# Patient Record
Sex: Female | Born: 1970 | Race: Black or African American | Hispanic: No | Smoking: Never smoker
Health system: Southern US, Community
[De-identification: ages and names within clinical notes are randomized; demographics above are authoritative.]

## PROBLEM LIST (undated history)

## (undated) DIAGNOSIS — E611 Iron deficiency: Secondary | ICD-10-CM

## (undated) DIAGNOSIS — G40909 Epilepsy, unspecified, not intractable, without status epilepticus: Secondary | ICD-10-CM

## (undated) DIAGNOSIS — C73 Malignant neoplasm of thyroid gland: Secondary | ICD-10-CM

## (undated) DIAGNOSIS — E039 Hypothyroidism, unspecified: Secondary | ICD-10-CM

## (undated) HISTORY — PX: BREAST EXCISIONAL BIOPSY: SUR124

## (undated) HISTORY — PX: THYROIDECTOMY: SHX17

---

## 1998-12-20 ENCOUNTER — Other Ambulatory Visit: Admission: RE | Admit: 1998-12-20 | Discharge: 1998-12-20 | Payer: Self-pay | Admitting: *Deleted

## 1999-03-06 ENCOUNTER — Encounter: Payer: Self-pay | Admitting: Emergency Medicine

## 1999-03-06 ENCOUNTER — Emergency Department (HOSPITAL_COMMUNITY): Admission: EM | Admit: 1999-03-06 | Discharge: 1999-03-06 | Payer: Self-pay | Admitting: Emergency Medicine

## 1999-08-19 ENCOUNTER — Emergency Department (HOSPITAL_COMMUNITY): Admission: EM | Admit: 1999-08-19 | Discharge: 1999-08-19 | Payer: Self-pay | Admitting: *Deleted

## 1999-12-23 ENCOUNTER — Other Ambulatory Visit: Admission: RE | Admit: 1999-12-23 | Discharge: 1999-12-23 | Payer: Self-pay | Admitting: *Deleted

## 2001-01-16 ENCOUNTER — Other Ambulatory Visit: Admission: RE | Admit: 2001-01-16 | Discharge: 2001-01-16 | Payer: Self-pay | Admitting: Gynecology

## 2001-07-12 ENCOUNTER — Emergency Department (HOSPITAL_COMMUNITY): Admission: EM | Admit: 2001-07-12 | Discharge: 2001-07-12 | Payer: Self-pay | Admitting: Emergency Medicine

## 2001-07-12 ENCOUNTER — Encounter: Payer: Self-pay | Admitting: *Deleted

## 2002-01-23 ENCOUNTER — Other Ambulatory Visit: Admission: RE | Admit: 2002-01-23 | Discharge: 2002-01-23 | Payer: Self-pay | Admitting: Gynecology

## 2002-07-13 ENCOUNTER — Emergency Department (HOSPITAL_COMMUNITY): Admission: EM | Admit: 2002-07-13 | Discharge: 2002-07-14 | Payer: Self-pay | Admitting: Emergency Medicine

## 2002-07-14 ENCOUNTER — Encounter: Payer: Self-pay | Admitting: Emergency Medicine

## 2003-01-15 ENCOUNTER — Encounter: Payer: Self-pay | Admitting: Obstetrics & Gynecology

## 2003-01-15 ENCOUNTER — Ambulatory Visit (HOSPITAL_COMMUNITY): Admission: RE | Admit: 2003-01-15 | Discharge: 2003-01-15 | Payer: Self-pay | Admitting: Obstetrics & Gynecology

## 2004-11-23 ENCOUNTER — Encounter: Admission: RE | Admit: 2004-11-23 | Discharge: 2005-02-21 | Payer: Self-pay | Admitting: Obstetrics and Gynecology

## 2008-02-03 ENCOUNTER — Encounter (HOSPITAL_COMMUNITY): Admission: RE | Admit: 2008-02-03 | Discharge: 2008-02-04 | Payer: Self-pay | Admitting: Internal Medicine

## 2008-02-27 ENCOUNTER — Other Ambulatory Visit: Admission: RE | Admit: 2008-02-27 | Discharge: 2008-02-27 | Payer: Self-pay | Admitting: Surgery

## 2008-05-28 ENCOUNTER — Emergency Department (HOSPITAL_COMMUNITY): Admission: EM | Admit: 2008-05-28 | Discharge: 2008-05-28 | Payer: Self-pay | Admitting: Emergency Medicine

## 2008-12-22 ENCOUNTER — Encounter: Admission: RE | Admit: 2008-12-22 | Discharge: 2008-12-22 | Payer: Self-pay | Admitting: Internal Medicine

## 2009-12-02 ENCOUNTER — Emergency Department (HOSPITAL_COMMUNITY): Admission: EM | Admit: 2009-12-02 | Discharge: 2009-12-02 | Payer: Self-pay | Admitting: Family Medicine

## 2010-01-24 ENCOUNTER — Ambulatory Visit (HOSPITAL_COMMUNITY): Admission: RE | Admit: 2010-01-24 | Discharge: 2010-01-25 | Payer: Self-pay | Admitting: Surgery

## 2010-01-24 ENCOUNTER — Encounter (INDEPENDENT_AMBULATORY_CARE_PROVIDER_SITE_OTHER): Payer: Self-pay | Admitting: Surgery

## 2010-03-16 ENCOUNTER — Encounter (HOSPITAL_COMMUNITY): Admission: RE | Admit: 2010-03-16 | Discharge: 2010-05-03 | Payer: Self-pay | Admitting: Internal Medicine

## 2010-09-02 ENCOUNTER — Emergency Department (HOSPITAL_COMMUNITY)
Admission: EM | Admit: 2010-09-02 | Discharge: 2010-09-02 | Payer: Self-pay | Source: Home / Self Care | Admitting: Emergency Medicine

## 2010-10-14 ENCOUNTER — Encounter
Admission: RE | Admit: 2010-10-14 | Discharge: 2010-10-14 | Payer: Self-pay | Source: Home / Self Care | Attending: Internal Medicine | Admitting: Internal Medicine

## 2010-11-12 ENCOUNTER — Emergency Department (HOSPITAL_COMMUNITY)
Admission: EM | Admit: 2010-11-12 | Discharge: 2010-11-12 | Payer: Self-pay | Source: Home / Self Care | Admitting: Emergency Medicine

## 2010-11-21 LAB — POCT I-STAT, CHEM 8
BUN: 10 mg/dL (ref 6–23)
Calcium, Ion: 1.13 mmol/L (ref 1.12–1.32)
Chloride: 110 mEq/L (ref 96–112)
Creatinine, Ser: 0.5 mg/dL (ref 0.4–1.2)
Glucose, Bld: 95 mg/dL (ref 70–99)
HCT: 34 % — ABNORMAL LOW (ref 36.0–46.0)
Hemoglobin: 11.6 g/dL — ABNORMAL LOW (ref 12.0–15.0)
Potassium: 4.3 mEq/L (ref 3.5–5.1)
Sodium: 140 mEq/L (ref 135–145)
TCO2: 19 mmol/L (ref 0–100)

## 2010-11-21 LAB — POCT PREGNANCY, URINE: Preg Test, Ur: NEGATIVE

## 2010-12-01 ENCOUNTER — Encounter
Admission: RE | Admit: 2010-12-01 | Discharge: 2010-12-01 | Payer: Self-pay | Source: Home / Self Care | Attending: Internal Medicine | Admitting: Internal Medicine

## 2010-12-07 ENCOUNTER — Encounter: Payer: Self-pay | Admitting: Internal Medicine

## 2011-01-06 ENCOUNTER — Emergency Department (HOSPITAL_COMMUNITY)
Admission: EM | Admit: 2011-01-06 | Discharge: 2011-01-06 | Disposition: A | Discharge status: Home / Self Care | Payer: BC Managed Care – PPO | Attending: Emergency Medicine | Admitting: Emergency Medicine

## 2011-01-06 DIAGNOSIS — G40909 Epilepsy, unspecified, not intractable, without status epilepticus: Secondary | ICD-10-CM | POA: Insufficient documentation

## 2011-01-06 DIAGNOSIS — G4700 Insomnia, unspecified: Secondary | ICD-10-CM | POA: Insufficient documentation

## 2011-01-24 LAB — HCG, SERUM, QUALITATIVE: Preg, Serum: NEGATIVE

## 2011-01-30 LAB — COMPREHENSIVE METABOLIC PANEL
ALT: 8 U/L (ref 0–35)
AST: 14 U/L (ref 0–37)
Albumin: 4.1 g/dL (ref 3.5–5.2)
Alkaline Phosphatase: 58 U/L (ref 39–117)
BUN: 10 mg/dL (ref 6–23)
CO2: 25 mEq/L (ref 19–32)
Calcium: 9 mg/dL (ref 8.4–10.5)
Chloride: 104 mEq/L (ref 96–112)
Creatinine, Ser: 0.63 mg/dL (ref 0.4–1.2)
GFR calc Af Amer: >60 mL/min (ref >60)
GFR calc non Af Amer: >60 mL/min (ref >60)
Glucose, Bld: 76 mg/dL (ref 70–99)
Potassium: 3.9 mEq/L (ref 3.5–5.1)
Sodium: 134 mEq/L — ABNORMAL LOW (ref 135–145)
Total Bilirubin: 0.5 mg/dL (ref 0.3–1.2)
Total Protein: 7.3 g/dL (ref 6.0–8.3)

## 2011-01-30 LAB — CBC
HCT: 33.9 % — ABNORMAL LOW (ref 36.0–46.0)
Hemoglobin: 11.5 g/dL — ABNORMAL LOW (ref 12.0–15.0)
MCHC: 33.8 g/dL (ref 30.0–36.0)
MCV: 82.2 fL (ref 78.0–100.0)
Platelets: 231 10*3/uL (ref 150–400)
RBC: 4.13 MIL/uL (ref 3.87–5.11)
RDW: 16.7 % — ABNORMAL HIGH (ref 11.5–15.5)
WBC: 6.9 10*3/uL (ref 4.0–10.5)

## 2011-01-30 LAB — DIFFERENTIAL
Basophils Absolute: 0 10*3/uL (ref 0.0–0.1)
Basophils Relative: 0 % (ref 0–1)
Eosinophils Absolute: 0.1 10*3/uL (ref 0.0–0.7)
Eosinophils Relative: 2 % (ref 0–5)
Lymphocytes Relative: 25 % (ref 12–46)
Lymphs Abs: 1.7 10*3/uL (ref 0.7–4.0)
Monocytes Absolute: 0.6 10*3/uL (ref 0.1–1.0)
Monocytes Relative: 8 % (ref 3–12)
Neutro Abs: 4.5 10*3/uL (ref 1.7–7.7)
Neutrophils Relative %: 65 % (ref 43–77)

## 2011-01-30 LAB — CALCIUM
Calcium: 8 mg/dL — ABNORMAL LOW (ref 8.4–10.5)
Calcium: 8.3 mg/dL — ABNORMAL LOW (ref 8.4–10.5)

## 2011-01-30 LAB — HCG, SERUM, QUALITATIVE: Preg, Serum: NEGATIVE

## 2011-03-27 ENCOUNTER — Emergency Department (HOSPITAL_COMMUNITY)
Admission: EM | Admit: 2011-03-27 | Discharge: 2011-03-27 | Disposition: A | Discharge status: Home / Self Care | Payer: BC Managed Care – PPO | Attending: Emergency Medicine | Admitting: Emergency Medicine

## 2011-03-27 DIAGNOSIS — Z85850 Personal history of malignant neoplasm of thyroid: Secondary | ICD-10-CM | POA: Insufficient documentation

## 2011-03-27 DIAGNOSIS — Z79899 Other long term (current) drug therapy: Secondary | ICD-10-CM | POA: Insufficient documentation

## 2011-03-27 DIAGNOSIS — G40909 Epilepsy, unspecified, not intractable, without status epilepticus: Secondary | ICD-10-CM | POA: Insufficient documentation

## 2011-04-19 ENCOUNTER — Other Ambulatory Visit (HOSPITAL_COMMUNITY): Payer: Self-pay | Admitting: Internal Medicine

## 2011-04-19 DIAGNOSIS — C73 Malignant neoplasm of thyroid gland: Secondary | ICD-10-CM

## 2011-05-22 ENCOUNTER — Encounter (HOSPITAL_COMMUNITY)
Admission: RE | Admit: 2011-05-22 | Discharge: 2011-05-22 | Disposition: A | Discharge status: Home / Self Care | Payer: BC Managed Care – PPO | Source: Ambulatory Visit | Attending: Internal Medicine | Admitting: Internal Medicine

## 2011-05-22 DIAGNOSIS — C73 Malignant neoplasm of thyroid gland: Secondary | ICD-10-CM | POA: Insufficient documentation

## 2011-05-23 ENCOUNTER — Encounter (HOSPITAL_COMMUNITY)
Admission: RE | Admit: 2011-05-23 | Discharge: 2011-05-23 | Disposition: A | Discharge status: Home / Self Care | Payer: BC Managed Care – PPO | Source: Ambulatory Visit | Attending: Internal Medicine | Admitting: Internal Medicine

## 2011-05-23 DIAGNOSIS — C73 Malignant neoplasm of thyroid gland: Secondary | ICD-10-CM | POA: Insufficient documentation

## 2011-05-23 DIAGNOSIS — Z09 Encounter for follow-up examination after completed treatment for conditions other than malignant neoplasm: Secondary | ICD-10-CM | POA: Insufficient documentation

## 2011-05-24 ENCOUNTER — Encounter (HOSPITAL_COMMUNITY)
Admission: RE | Admit: 2011-05-24 | Discharge: 2011-05-24 | Disposition: A | Discharge status: Home / Self Care | Payer: BC Managed Care – PPO | Source: Ambulatory Visit | Attending: Internal Medicine | Admitting: Internal Medicine

## 2011-05-24 LAB — HCG, SERUM, QUALITATIVE: Preg, Serum: NEGATIVE

## 2011-05-26 ENCOUNTER — Encounter (HOSPITAL_COMMUNITY)
Admission: RE | Admit: 2011-05-26 | Discharge: 2011-05-26 | Disposition: A | Discharge status: Home / Self Care | Payer: BC Managed Care – PPO | Source: Ambulatory Visit | Attending: Internal Medicine | Admitting: Internal Medicine

## 2011-05-26 MED ORDER — SODIUM IODIDE I 131 CAPSULE
4.0000 | Freq: Once | INTRAVENOUS | Status: AC | PRN
  Administered 2011-05-26: 4 via ORAL

## 2011-08-04 LAB — URINALYSIS, ROUTINE W REFLEX MICROSCOPIC
Glucose, UA: NEGATIVE
Hgb urine dipstick: NEGATIVE
Protein, ur: NEGATIVE
Specific Gravity, Urine: 1.018
pH: 6

## 2011-08-04 LAB — POCT PREGNANCY, URINE
Operator id: 231701
Preg Test, Ur: NEGATIVE

## 2011-08-04 LAB — DIFFERENTIAL
Basophils Absolute: 0
Lymphocytes Relative: 10 — ABNORMAL LOW
Lymphs Abs: 0.9
Neutro Abs: 7.4

## 2011-08-04 LAB — CBC
HCT: 32.9 — ABNORMAL LOW
Hemoglobin: 11 — ABNORMAL LOW
Platelets: 239
RDW: 14.1
WBC: 9

## 2011-08-04 LAB — POCT I-STAT, CHEM 8
BUN: 12
Chloride: 107
Creatinine, Ser: 0.7
Potassium: 3.9
Sodium: 139

## 2011-08-04 LAB — URINE MICROSCOPIC-ADD ON

## 2012-05-02 ENCOUNTER — Other Ambulatory Visit: Payer: Self-pay | Admitting: Internal Medicine

## 2012-05-02 DIAGNOSIS — C73 Malignant neoplasm of thyroid gland: Secondary | ICD-10-CM

## 2012-05-10 ENCOUNTER — Other Ambulatory Visit: Payer: Self-pay | Admitting: Internal Medicine

## 2012-05-10 ENCOUNTER — Ambulatory Visit
Admission: RE | Admit: 2012-05-10 | Discharge: 2012-05-10 | Disposition: A | Discharge status: Home / Self Care | Payer: BC Managed Care – PPO | Source: Ambulatory Visit | Attending: Internal Medicine | Admitting: Internal Medicine

## 2012-05-10 DIAGNOSIS — C73 Malignant neoplasm of thyroid gland: Secondary | ICD-10-CM

## 2012-05-10 DIAGNOSIS — Z1231 Encounter for screening mammogram for malignant neoplasm of breast: Secondary | ICD-10-CM

## 2012-05-13 ENCOUNTER — Other Ambulatory Visit: Payer: BC Managed Care – PPO

## 2012-05-15 ENCOUNTER — Ambulatory Visit
Admission: RE | Admit: 2012-05-15 | Discharge: 2012-05-15 | Disposition: A | Discharge status: Home / Self Care | Payer: BC Managed Care – PPO | Source: Ambulatory Visit | Attending: Internal Medicine | Admitting: Internal Medicine

## 2012-05-15 DIAGNOSIS — Z1231 Encounter for screening mammogram for malignant neoplasm of breast: Secondary | ICD-10-CM

## 2013-06-17 ENCOUNTER — Other Ambulatory Visit: Payer: Self-pay

## 2013-06-17 DIAGNOSIS — Z1231 Encounter for screening mammogram for malignant neoplasm of breast: Secondary | ICD-10-CM

## 2013-06-27 ENCOUNTER — Ambulatory Visit
Admission: RE | Admit: 2013-06-27 | Discharge: 2013-06-27 | Disposition: A | Discharge status: Home / Self Care | Payer: BC Managed Care – PPO | Source: Ambulatory Visit

## 2013-06-27 DIAGNOSIS — Z1231 Encounter for screening mammogram for malignant neoplasm of breast: Secondary | ICD-10-CM

## 2013-12-18 ENCOUNTER — Emergency Department (INDEPENDENT_AMBULATORY_CARE_PROVIDER_SITE_OTHER): Payer: BC Managed Care – PPO

## 2013-12-18 ENCOUNTER — Emergency Department (HOSPITAL_COMMUNITY)
Admission: EM | Admit: 2013-12-18 | Discharge: 2013-12-18 | Disposition: A | Discharge status: Home / Self Care | Payer: BC Managed Care – PPO | Source: Home / Self Care | Attending: Family Medicine | Admitting: Family Medicine

## 2013-12-18 ENCOUNTER — Encounter (HOSPITAL_COMMUNITY): Payer: Self-pay | Admitting: Family Medicine

## 2013-12-18 DIAGNOSIS — S9030XA Contusion of unspecified foot, initial encounter: Secondary | ICD-10-CM

## 2013-12-18 HISTORY — DX: Iron deficiency: E61.1

## 2013-12-18 HISTORY — DX: Hypothyroidism, unspecified: E03.9

## 2013-12-18 HISTORY — DX: Epilepsy, unspecified, not intractable, without status epilepticus: G40.909

## 2013-12-18 HISTORY — DX: Malignant neoplasm of thyroid gland: C73

## 2013-12-18 NOTE — Discharge Instructions (Signed)
Foot Contusion °A foot contusion is a deep bruise to the foot. Contusions are the result of an injury that caused bleeding under the skin. The contusion may turn blue, purple, or yellow. Minor injuries will give you a painless contusion, but more severe contusions may stay painful and swollen for a few weeks. °CAUSES  °A foot contusion comes from a direct blow to that area, such as a heavy object falling on the foot. °SYMPTOMS  °· Swelling of the foot. °· Discoloration of the foot. °· Tenderness or soreness of the foot. °DIAGNOSIS  °You will have a physical exam and will be asked about your history. You may need an X-ray of your foot to look for a broken bone (fracture).  °TREATMENT  °An elastic wrap may be recommended to support your foot. Resting, elevating, and applying cold compresses to your foot are often the best treatments for a foot contusion. Over-the-counter medicines may also be recommended for pain control. °HOME CARE INSTRUCTIONS  °· Put ice on the injured area. °· Put ice in a plastic bag. °· Place a towel between your skin and the bag. °· Leave the ice on for 15-20 minutes, 03-04 times a day. °· Only take over-the-counter or prescription medicines for pain, discomfort, or fever as directed by your caregiver. °· If told, use an elastic wrap as directed. This can help reduce swelling. You may remove the wrap for sleeping, showering, and bathing. If your toes become numb, cold, or blue, take the wrap off and reapply it more loosely. °· Elevate your foot with pillows to reduce swelling. °· Try to avoid standing or walking while the foot is painful. Do not resume use until instructed by your caregiver. Then, begin use gradually. If pain develops, decrease use. Gradually increase activities that do not cause discomfort until you have normal use of your foot. °· See your caregiver as directed. It is very important to keep all follow-up appointments in order to avoid any lasting problems with your foot,  including long-term (chronic) pain. °SEEK IMMEDIATE MEDICAL CARE IF:  °· You have increased redness, swelling, or pain in your foot. °· Your swelling or pain is not relieved with medicines. °· You have loss of feeling in your foot or are unable to move your toes. °· Your foot turns cold or blue. °· You have pain when you move your toes. °· Your foot becomes warm to the touch. °· Your contusion does not improve in 2 days. °MAKE SURE YOU:  °· Understand these instructions. °· Will watch your condition. °· Will get help right away if you are not doing well or get worse. °Document Released: 08/14/2006 Document Revised: 04/23/2012 Document Reviewed: 09/26/2011 °[employer]® Patient Information ©2014 [employer]. ° °

## 2013-12-18 NOTE — ED Provider Notes (Signed)
CSN: [id number]     Arrival date & time 12/18/13  1618 History   First MD Initiated Contact with Patient 12/18/13 1745     Chief Complaint  Patient presents with  . Toe Injury     (Consider location/radiation/quality/duration/timing/severity/associated sxs/prior Treatment) HPI  Right foot injury. Occurred two weeks ago when she dropped a case of paper on it. Initially with swelling and pain that subsided after about 7 days. However, she went out in high heels 5 days ago and the pain was exacerbated and worsening since that point. Pain is exacerbated with pressure. It is alleviated by rest. Alleve ineffective.   Past Medical History  Diagnosis Date  . Seizure disorder   . Iron deficiency   . Thyroid cancer   . Acquired hypothyroidism    Past Surgical History  Procedure Laterality Date  . Thyroidectomy     Family History  Problem Relation Age of Onset  . Hypertension Mother   . Hypertension Father    History  Substance Use Topics  . Smoking status: Never Smoker   . Smokeless tobacco: Not on file  . Alcohol Use: No   OB History   Grav Para Term Preterm Abortions TAB SAB Ect Mult Living                 Review of Systems  See HPI  Allergies  Review of patient's allergies indicates no known allergies.  Home Medications   Current Outpatient Rx  Name  Route  Sig  Dispense  Refill  . cholecalciferol (VITAMIN D) 1000 UNITS tablet   Oral   Take 1,000 Units by mouth daily.         . Fe Fum-Fe Poly-Vit C-Lactobac (FUSION PO)   Oral   Take by mouth.         . lamoTRIgine (LAMICTAL) 100 MG tablet   Oral   Take 100 mg by mouth daily.         Marland Kitchen levothyroxine (SYNTHROID, LEVOTHROID) 150 MCG tablet   Oral   Take 150 mcg by mouth daily before breakfast.          BP 123/75  Pulse 73  Temp(Src) 98.8 F (37.1 C) (Oral)  Resp 16  SpO2 100%  LMP 12/18/2013 Physical Exam  Constitutional: She appears well-developed and well-nourished. No distress.    Musculoskeletal:       Right foot: She exhibits tenderness, bony tenderness and swelling. She exhibits no deformity.       Feet:    ED Course  Procedures (including critical care time) Labs Review Labs Reviewed - No data to display Imaging Review Dg Foot 2 Views Right  12/18/2013   CLINICAL DATA:  Trauma  EXAM: RIGHT FOOT - 2 VIEW  COMPARISON:  None.  FINDINGS: There is no evidence of fracture or dislocation. There is no evidence of arthropathy or other focal bone abnormality. Soft tissues are unremarkable.  IMPRESSION: There is no acute fracture or dislocation.   Electronically Signed   By: Abelardo Diesel M.D.   On: 12/18/2013 18:15      MDM   Final diagnoses:  Contusion, foot    No fracture. Given pain will give post-op shoe to use for next 7 days as needed.     Angelica Ran, MD 12/18/13 2340

## 2013-12-18 NOTE — ED Notes (Signed)
Dropped a case of paper on her R 2nd toe 2 weeks ago. Pain radiates to dorsum of her foot.  Swelling went down and pain went away.  She wore a shoe with a heel on Sat. and it swelled back up and started hurting again.

## 2013-12-19 NOTE — ED Provider Notes (Signed)
Medical screening examination/treatment/procedure(s) were performed by a resident physician or non-physician practitioner and as the supervising physician I was immediately available for consultation/collaboration.  Lynne Leader, MD    Gregor Hams, MD 12/19/13 [phone]

## 2014-02-04 ENCOUNTER — Encounter: Payer: Self-pay | Admitting: Obstetrics & Gynecology

## 2014-02-18 ENCOUNTER — Ambulatory Visit: Payer: BC Managed Care – PPO | Admitting: Obstetrics & Gynecology

## 2014-06-17 ENCOUNTER — Other Ambulatory Visit: Payer: Self-pay | Admitting: Obstetrics and Gynecology

## 2014-11-02 ENCOUNTER — Encounter: Payer: Self-pay | Admitting: *Deleted

## 2014-11-03 ENCOUNTER — Encounter: Payer: Self-pay | Admitting: Obstetrics & Gynecology

## 2015-06-30 ENCOUNTER — Other Ambulatory Visit: Payer: Self-pay | Admitting: Internal Medicine

## 2015-06-30 DIAGNOSIS — Z85850 Personal history of malignant neoplasm of thyroid: Secondary | ICD-10-CM

## 2015-07-02 ENCOUNTER — Ambulatory Visit
Admission: RE | Admit: 2015-07-02 | Discharge: 2015-07-02 | Disposition: A | Discharge status: Home / Self Care | Payer: BC Managed Care – PPO | Source: Ambulatory Visit | Attending: Internal Medicine | Admitting: Internal Medicine

## 2015-07-02 DIAGNOSIS — Z85850 Personal history of malignant neoplasm of thyroid: Secondary | ICD-10-CM

## 2015-07-23 ENCOUNTER — Other Ambulatory Visit: Payer: Self-pay | Admitting: Internal Medicine

## 2015-07-23 DIAGNOSIS — E041 Nontoxic single thyroid nodule: Secondary | ICD-10-CM

## 2015-07-26 ENCOUNTER — Other Ambulatory Visit (HOSPITAL_COMMUNITY): Payer: Self-pay | Admitting: Internal Medicine

## 2015-07-26 DIAGNOSIS — E041 Nontoxic single thyroid nodule: Secondary | ICD-10-CM

## 2015-08-02 ENCOUNTER — Ambulatory Visit (HOSPITAL_COMMUNITY)
Admission: RE | Admit: 2015-08-02 | Discharge: 2015-08-02 | Disposition: A | Discharge status: Home / Self Care | Payer: BC Managed Care – PPO | Source: Ambulatory Visit | Attending: Internal Medicine | Admitting: Internal Medicine

## 2015-08-02 DIAGNOSIS — E890 Postprocedural hypothyroidism: Secondary | ICD-10-CM | POA: Diagnosis not present

## 2015-08-02 DIAGNOSIS — M799 Soft tissue disorder, unspecified: Secondary | ICD-10-CM | POA: Insufficient documentation

## 2015-08-02 DIAGNOSIS — Z85850 Personal history of malignant neoplasm of thyroid: Secondary | ICD-10-CM | POA: Diagnosis not present

## 2015-08-02 DIAGNOSIS — E041 Nontoxic single thyroid nodule: Secondary | ICD-10-CM

## 2015-08-02 MED ORDER — LIDOCAINE HCL (PF) 1 % IJ SOLN
INTRAMUSCULAR | Status: AC
  Filled 2015-08-02: qty 10

## 2015-08-02 NOTE — Procedures (Signed)
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  Complications: None Recommendations:  - Ok to shower tomorrow - Do not submerge for 7 days - Routine line care  - follow up result  Signed,  Dulcy Fanny. Earleen Newport, DO

## 2015-08-19 ENCOUNTER — Other Ambulatory Visit: Payer: BC Managed Care – PPO

## 2015-08-30 ENCOUNTER — Other Ambulatory Visit: Payer: Self-pay | Admitting: Internal Medicine

## 2015-08-30 DIAGNOSIS — C73 Malignant neoplasm of thyroid gland: Secondary | ICD-10-CM

## 2015-12-10 ENCOUNTER — Other Ambulatory Visit: Payer: Self-pay | Admitting: Internal Medicine

## 2015-12-10 DIAGNOSIS — E2839 Other primary ovarian failure: Secondary | ICD-10-CM

## 2015-12-14 ENCOUNTER — Ambulatory Visit
Admission: RE | Admit: 2015-12-14 | Discharge: 2015-12-14 | Disposition: A | Discharge status: Home / Self Care | Payer: BC Managed Care – PPO | Source: Ambulatory Visit | Attending: Internal Medicine | Admitting: Internal Medicine

## 2015-12-14 ENCOUNTER — Other Ambulatory Visit: Payer: Self-pay | Admitting: Internal Medicine

## 2015-12-14 DIAGNOSIS — Z1231 Encounter for screening mammogram for malignant neoplasm of breast: Secondary | ICD-10-CM

## 2015-12-14 DIAGNOSIS — E2839 Other primary ovarian failure: Secondary | ICD-10-CM

## 2015-12-15 ENCOUNTER — Ambulatory Visit
Admission: RE | Admit: 2015-12-15 | Discharge: 2015-12-15 | Disposition: A | Discharge status: Home / Self Care | Payer: BC Managed Care – PPO | Source: Ambulatory Visit | Attending: Internal Medicine | Admitting: Internal Medicine

## 2015-12-15 DIAGNOSIS — Z1231 Encounter for screening mammogram for malignant neoplasm of breast: Secondary | ICD-10-CM

## 2016-02-29 ENCOUNTER — Inpatient Hospital Stay: Admission: RE | Admit: 2016-02-29 | Payer: BC Managed Care – PPO | Source: Ambulatory Visit

## 2016-04-05 ENCOUNTER — Ambulatory Visit
Admission: RE | Admit: 2016-04-05 | Discharge: 2016-04-05 | Disposition: A | Discharge status: Home / Self Care | Payer: BC Managed Care – PPO | Source: Ambulatory Visit | Attending: Internal Medicine | Admitting: Internal Medicine

## 2016-04-05 DIAGNOSIS — C73 Malignant neoplasm of thyroid gland: Secondary | ICD-10-CM

## 2016-09-20 ENCOUNTER — Emergency Department (HOSPITAL_COMMUNITY)
Admission: EM | Admit: 2016-09-20 | Discharge: 2016-09-20 | Disposition: A | Discharge status: Home / Self Care | Payer: BC Managed Care – PPO | Attending: Emergency Medicine | Admitting: Emergency Medicine

## 2016-09-20 ENCOUNTER — Encounter (HOSPITAL_COMMUNITY): Payer: Self-pay | Admitting: *Deleted

## 2016-09-20 DIAGNOSIS — T781XXA Other adverse food reactions, not elsewhere classified, initial encounter: Secondary | ICD-10-CM | POA: Diagnosis present

## 2016-09-20 DIAGNOSIS — T782XXA Anaphylactic shock, unspecified, initial encounter: Secondary | ICD-10-CM

## 2016-09-20 DIAGNOSIS — Z85850 Personal history of malignant neoplasm of thyroid: Secondary | ICD-10-CM | POA: Insufficient documentation

## 2016-09-20 DIAGNOSIS — T7805XA Anaphylactic reaction due to tree nuts and seeds, initial encounter: Secondary | ICD-10-CM | POA: Insufficient documentation

## 2016-09-20 MED ORDER — EPINEPHRINE 0.3 MG/0.3ML IJ SOAJ: 0.3000 mg | Freq: Once | INTRAMUSCULAR | 1 refills | Status: AC

## 2016-09-20 MED ORDER — ONDANSETRON HCL 4 MG/2ML IJ SOLN
4.0000 mg | Freq: Once | INTRAMUSCULAR | Status: AC
  Administered 2016-09-20: 4 mg via INTRAVENOUS
  Filled 2016-09-20: qty 2

## 2016-09-20 MED ORDER — PREDNISONE 20 MG PO TABS: ORAL_TABLET | ORAL | 0 refills | Status: DC

## 2016-09-20 NOTE — ED Notes (Signed)
Bed: [id number] Expected date:  Expected time:  Means of arrival:  Comments: EMS allergic reaction

## 2016-09-20 NOTE — ED Provider Notes (Signed)
[facility] DEPT Provider Note   CSN: [id number] Arrival date & time: 09/20/16  1306     History   Chief Complaint Chief Complaint  Patient presents with  . Allergic Reaction    HPI Tammy Nichols is a 45 y.o. female.  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 She was given a bagel by a school teacher and did not rouse and had walnuts. Immediately after eating she started feeling lip burning, roof of the mouth swelling, throat burning and itching and having a hoarse voice. Denies any diffuse rash. The school nurse gave her an EpiPen and she started to feel somewhat better. She also started having nausea and dry heaving after the EpiPen. She feels like all the swelling and itching and burning is gone. She was given solumedrol and Benadryl by EMS. Also given albuterol. Her EpiPen is currently expired. She has never had anaphylaxis before.  Past Medical History:  Diagnosis Date  . Acquired hypothyroidism   . Iron deficiency   . Seizure disorder ([place])   . Thyroid cancer ([place])     There are no active problems to display for this patient.   Past Surgical History:  Procedure Laterality Date  . THYROIDECTOMY      OB History    No data available       Home Medications    Prior to Admission medications   Medication Sig Start Date End Date Taking? Authorizing Provider  lamoTRIgine (LAMICTAL) 100 MG tablet Take 100 mg by mouth daily.   Yes Historical Provider, MD  levothyroxine (SYNTHROID, LEVOTHROID) 112 MCG tablet Take 224 mcg by mouth daily. 07/04/16  Yes Historical Provider, MD  EPINEPHrine 0.3 mg/0.3 mL IJ SOAJ injection Inject 0.3 mLs (0.3 mg total) into the muscle once. 09/20/16 09/20/16  Sherwood Gambler, MD  predniSONE (DELTASONE) 20 MG tablet 2 tabs po daily x 4 days 09/21/16   Sherwood Gambler, MD    Family History Family History  Problem Relation Age of Onset  . Hypertension Mother   . Hypertension  Father     Social History Social History  Substance Use Topics  . Smoking status: Never Smoker  . Smokeless tobacco: Never Used  . Alcohol use No     Allergies   Other   Review of Systems Review of Systems  HENT: Positive for trouble swallowing and voice change.   Respiratory: Positive for shortness of breath.   Gastrointestinal: Positive for nausea. Negative for vomiting.  Skin: Negative for rash.  All other systems reviewed and are negative.    Physical Exam Updated Vital Signs BP 128/85   Pulse 92   Temp 98.3 F (36.8 C) (Oral)   Resp 20   LMP 06/06/2016 (Approximate)   SpO2 96%   Physical Exam  Constitutional: She is oriented to person, place, and time. She appears well-developed and well-nourished.  HENT:  Head: Normocephalic and atraumatic.  Right Ear: External ear normal.  Left Ear: External ear normal.  Nose: Nose normal.  Mouth/Throat: Oropharynx is clear and moist.  No oropharyngeal swelling, no lip swelling. No stridor  Eyes: Right eye exhibits no discharge. Left eye exhibits no discharge.  Cardiovascular: Normal rate, regular rhythm and normal heart sounds.   Pulmonary/Chest: Effort normal and breath sounds normal. No stridor. She has no wheezes.  Abdominal: Soft. She exhibits no distension.  Neurological: She is alert and oriented to person, place, and time.  Skin: Skin is warm and dry.  No rash noted.  Nursing note and vitals reviewed.    ED Treatments / Results  Labs (all labs ordered are listed, but only abnormal results are displayed) Labs Reviewed - No data to display  EKG  EKG Interpretation None       Radiology No results found.  Procedures Procedures (including critical care time)  Medications Ordered in ED Medications  ondansetron (ZOFRAN) injection 4 mg (4 mg Intravenous Given 09/20/16 1538)     Initial Impression / Assessment and Plan / ED Course  I have reviewed the triage vital signs and the nursing  notes.  Pertinent labs & imaging results that were available during my care of the patient were reviewed by me and considered in my medical decision making (see chart for details).  Clinical Course as of Sep 20 1710  Wed Sep 20, 2016  1401 Overall appears well. Zofran, monitor. Will need epipen, prednisone on discharge.   [SG]    Clinical Course User Index [SG] Sherwood Gambler, MD    Patient continues to feel well, no signs of recurrent allergic reaction or anaphylaxis. Will refill her epinephrine and give a burst of prednisone. It has now been over 4 hours and she was given an EpiPen. Discussed strict return precautions.  Final Clinical Impressions(s) / ED Diagnoses   Final diagnoses:  Anaphylaxis, initial encounter    New Prescriptions New Prescriptions   EPINEPHRINE 0.3 MG/0.3 ML IJ SOAJ INJECTION    Inject 0.3 mLs (0.3 mg total) into the muscle once.   PREDNISONE (DELTASONE) 20 MG TABLET    2 tabs po daily x 4 days     Sherwood Gambler, MD 09/20/16 1711

## 2016-09-20 NOTE — ED Triage Notes (Signed)
Per EMS pt had an allergic reaction to treenuts, epipen administered prior to their arrival, upon their arrival pt was still in significant distress. 5 mg albuterol, 125 mg IV solumedrol and 50 mg benadryl IM administered en route. Per EMS pt reports relief.

## 2016-09-20 NOTE — ED Notes (Signed)
Patient given ginger ale. 

## 2017-06-14 IMAGING — US US BIOPSY
1 series · 7 of 7 positions shown · non-contrast
Comparison: none

CLINICAL DATA: 44-year-old female with a history of papillary
thyroid carcinoma. Total thyroidectomy 5 years prior to today's
presentation.

[Series 1: us biopsy · 0.05mm/px · 7 acquisitions, 7 frames shown]
[im 1/7]
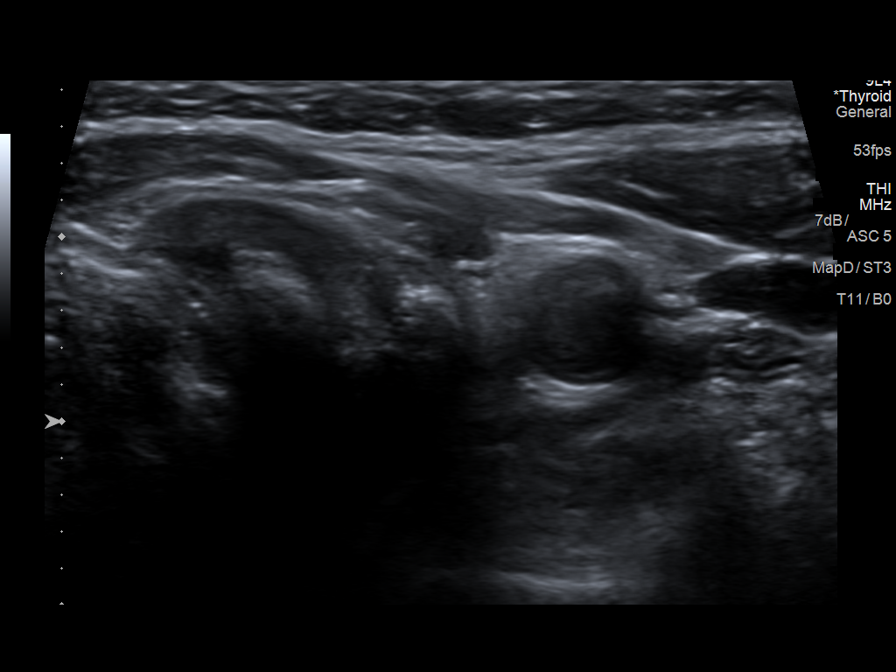
[im 2/7]
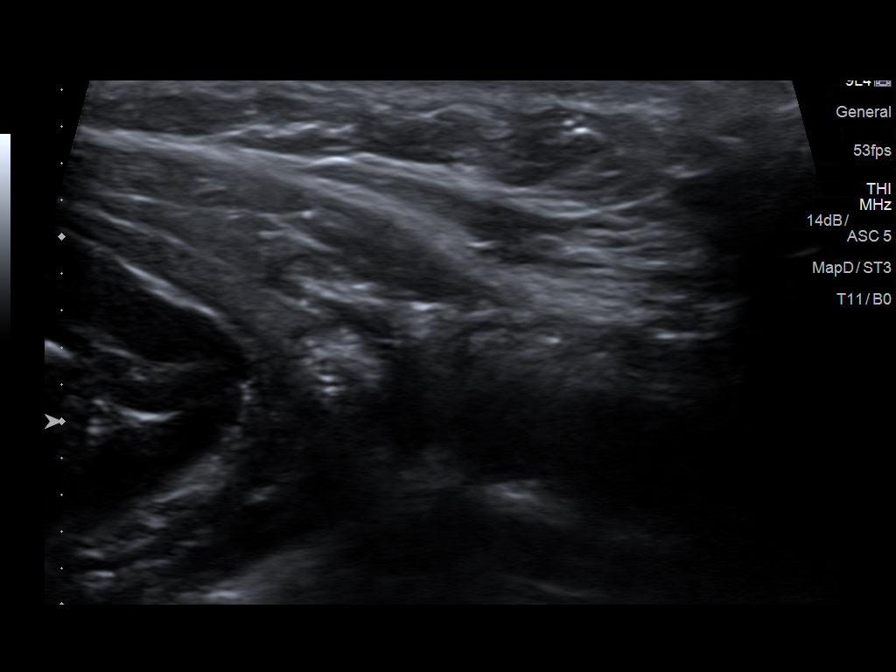
[im 3/7]
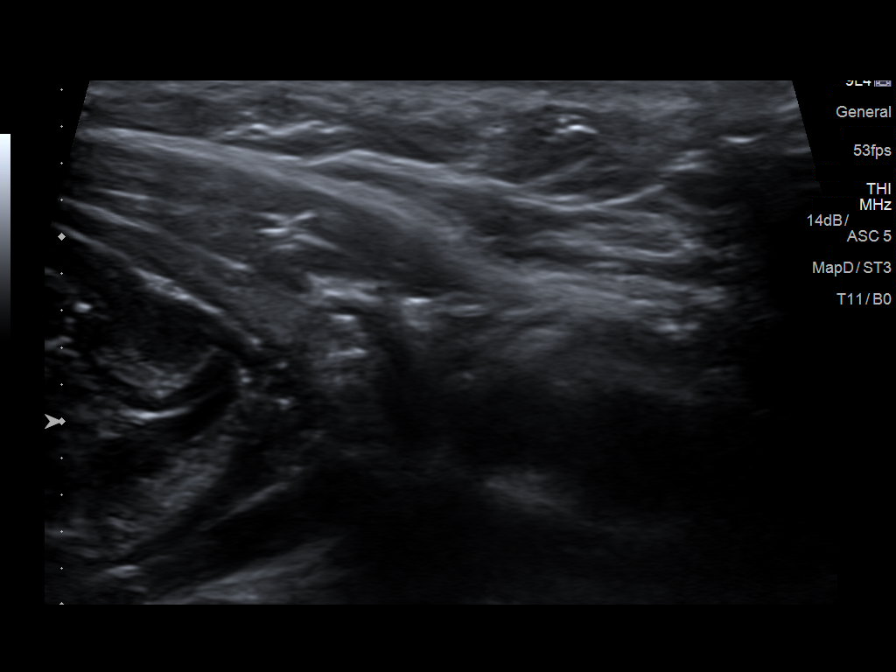
[im 4/7]
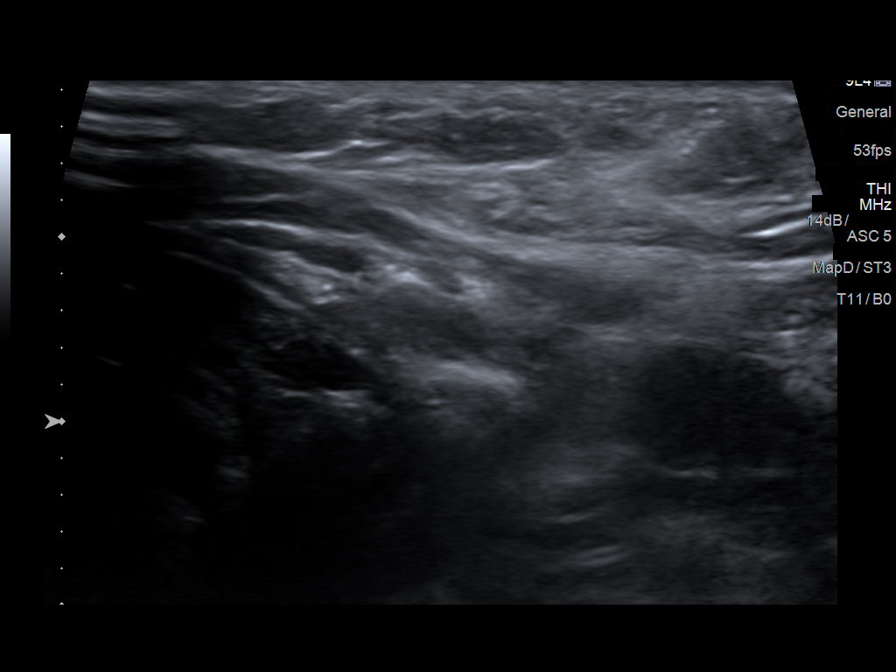
[im 5/7]
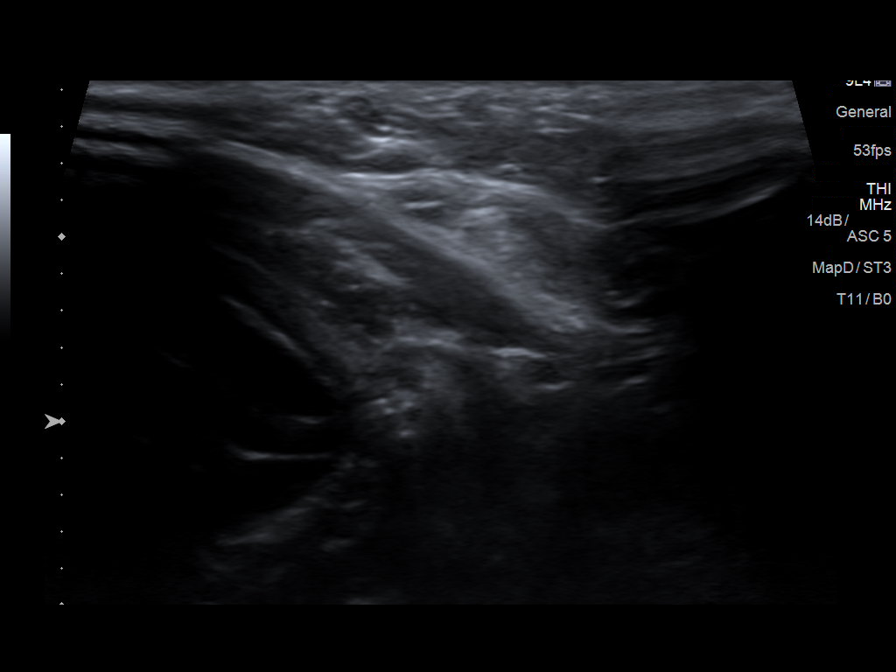
[im 6/7]
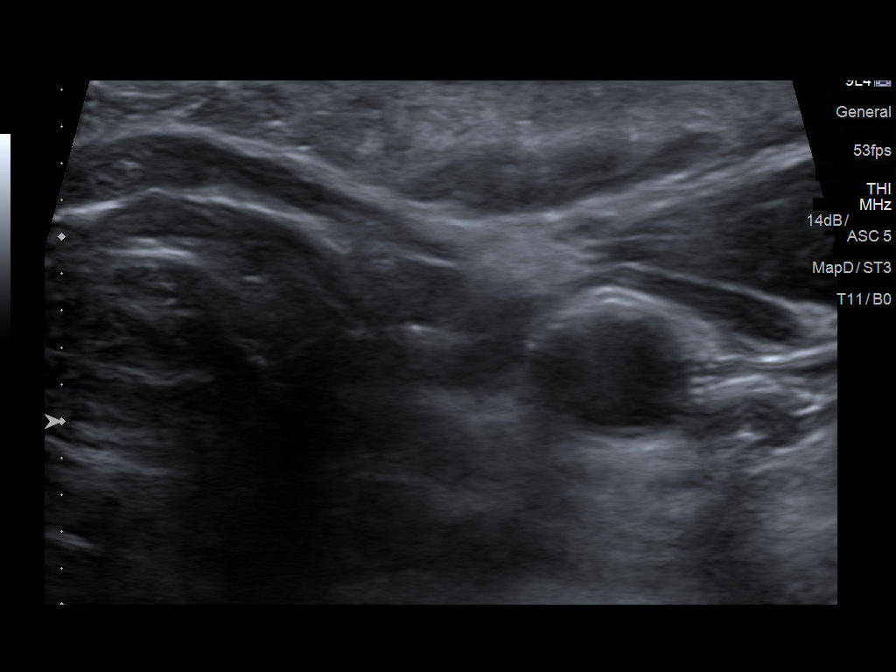
[im 7/7]
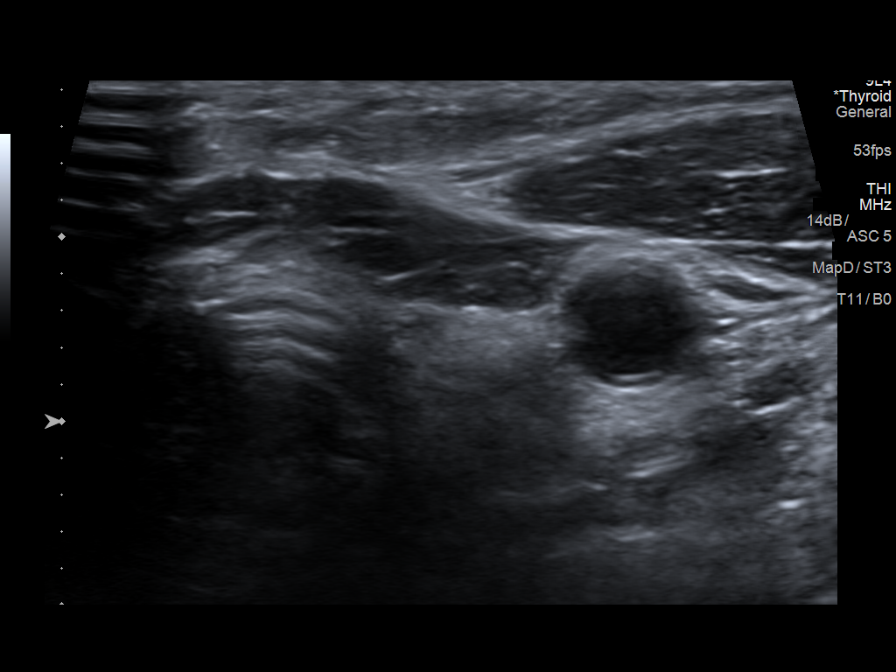

[7 of 7 positions shown; findings below may reference images not displayed]

Prior ultrasound study performed 07/02/2015 demonstrates small
amount of residual tissue in the left thyroid bed of uncertain
significance.

She has been referred for biopsy.

EXAM:
ULTRASOUND GUIDED FINE-NEEDLE ASPIRATE BIOPSY OF LEFT-SIDED THYROID
BED

MEDICATIONS:
None

PROCEDURE:
The procedure, risks, benefits, and alternatives were explained to
the patient. Questions regarding the procedure were encouraged and
answered. The patient understands and consents to the procedure.

Ultrasound survey was performed with images stored and sent to PACs.

The left neck was prepped with chlorhexidine in a sterile fashion,
and a sterile drape was applied covering the operative field. A
sterile gown and sterile gloves were used for the procedure. Local
anesthesia was provided with 1% Lidocaine.

Ultrasound guidance was used to infiltrate the region of 1%
lidocaine for local anesthesia. Questionable soft tissue in the left
thyroid bed was targeted for fine needle aspiration. Four separate
biopsy were acquired.

Slide preparation was performed by cytotechnologist.

Final image was stored after biopsy.

Patient tolerated the procedure well and remained hemodynamically
stable throughout.

No complications were encountered and no significant blood loss was
encounter

COMPLICATIONS:
None.
FINDINGS: Ultrasound survey demonstrates questionable soft tissue within the
left thyroid bed with no large lesion identified.

Images during the case demonstrate needle tip within the tissue on
each pass.

Final image demonstrates no complicating features.
IMPRESSION: Status post fine-needle aspiration of questionable tissue in the
left thyroid bed in this patient status post thyroidectomy for
papillary cell carcinoma. Tissue specimen sent to pathology for
complete histopathologic analysis.

## 2019-11-14 DIAGNOSIS — Z803 Family history of malignant neoplasm of breast: Secondary | ICD-10-CM | POA: Insufficient documentation

## 2019-12-09 ENCOUNTER — Other Ambulatory Visit: Payer: Self-pay | Admitting: Surgery

## 2019-12-11 ENCOUNTER — Other Ambulatory Visit: Payer: Self-pay | Admitting: Surgery

## 2019-12-11 DIAGNOSIS — R921 Mammographic calcification found on diagnostic imaging of breast: Secondary | ICD-10-CM

## 2020-01-01 ENCOUNTER — Ambulatory Visit: Payer: BC Managed Care – PPO | Attending: Internal Medicine

## 2020-01-01 DIAGNOSIS — Z23 Encounter for immunization: Secondary | ICD-10-CM

## 2020-01-01 DIAGNOSIS — R921 Mammographic calcification found on diagnostic imaging of breast: Secondary | ICD-10-CM | POA: Insufficient documentation

## 2020-01-01 NOTE — Progress Notes (Signed)
   Covid-19 Vaccination Clinic  Name:  Tammy Nichols    MRN: [id number] DOB: 10-06-1971  01/01/2020  [name] was observed post Covid-19 immunization for 15 minutes without incidence. She was provided with Vaccine Information Sheet and instruction to access the V-Safe system.   [name] was instructed to call 911 with any severe reactions post vaccine: Marland Kitchen Difficulty breathing  . Swelling of your face and throat  . A fast heartbeat  . A bad rash all over your body  . Dizziness and weakness    Immunizations Administered    Name Date Dose VIS Date Route   Pfizer COVID-19 Vaccine 01/01/2020 10:57 AM 0.3 mL 10/17/2019 Intramuscular   Manufacturer: [place]   Lot: [id number]   [place]: [id number]

## 2020-01-08 ENCOUNTER — Other Ambulatory Visit: Payer: Self-pay | Admitting: Surgery

## 2020-01-08 DIAGNOSIS — R921 Mammographic calcification found on diagnostic imaging of breast: Secondary | ICD-10-CM

## 2020-01-14 ENCOUNTER — Ambulatory Visit
Admission: RE | Admit: 2020-01-14 | Discharge: 2020-01-14 | Disposition: A | Discharge status: Home / Self Care | Payer: BC Managed Care – PPO | Source: Ambulatory Visit | Attending: Surgery | Admitting: Surgery

## 2020-01-14 ENCOUNTER — Other Ambulatory Visit: Payer: Self-pay

## 2020-01-14 DIAGNOSIS — R921 Mammographic calcification found on diagnostic imaging of breast: Secondary | ICD-10-CM

## 2020-01-27 ENCOUNTER — Ambulatory Visit: Payer: BC Managed Care – PPO | Attending: Internal Medicine

## 2020-01-27 DIAGNOSIS — Z23 Encounter for immunization: Secondary | ICD-10-CM

## 2020-01-27 NOTE — Progress Notes (Signed)
   Covid-19 Vaccination Clinic  Name:  Tammy Nichols    MRN: [id number] DOB: 02-21-1971  01/27/2020  Tammy Nichols was observed post Covid-19 immunization for 15 minutes without incident. She was provided with Vaccine Information Sheet and instruction to access the V-Safe system.   Tammy Nichols was instructed to call 911 with any severe reactions post vaccine: Marland Kitchen Difficulty breathing  . Swelling of face and throat  . A fast heartbeat  . A bad rash all over body  . Dizziness and weakness   Immunizations Administered    Name Date Dose VIS Date Route   Pfizer COVID-19 Vaccine 01/27/2020  8:53 AM 0.3 mL 10/17/2019 Intramuscular   Manufacturer: [place]   Lot: [id number]   [place]: [id number]

## 2020-01-28 ENCOUNTER — Other Ambulatory Visit: Payer: Self-pay | Admitting: Surgery

## 2020-01-28 DIAGNOSIS — R921 Mammographic calcification found on diagnostic imaging of breast: Secondary | ICD-10-CM

## 2020-02-16 ENCOUNTER — Other Ambulatory Visit: Payer: Self-pay | Admitting: Surgery

## 2020-02-16 DIAGNOSIS — N6091 Unspecified benign mammary dysplasia of right breast: Secondary | ICD-10-CM

## 2020-02-26 ENCOUNTER — Other Ambulatory Visit: Payer: Self-pay

## 2020-02-26 ENCOUNTER — Ambulatory Visit
Admission: RE | Admit: 2020-02-26 | Discharge: 2020-02-26 | Disposition: A | Discharge status: Home / Self Care | Payer: BC Managed Care – PPO | Source: Ambulatory Visit | Attending: Surgery | Admitting: Surgery

## 2020-02-26 DIAGNOSIS — R921 Mammographic calcification found on diagnostic imaging of breast: Secondary | ICD-10-CM

## 2020-02-26 MED ORDER — GADOBUTROL 1 MMOL/ML IV SOLN
8.0000 mL | Freq: Once | INTRAVENOUS | Status: AC | PRN
  Administered 2020-02-26: 8 mL via INTRAVENOUS

## 2020-03-05 ENCOUNTER — Other Ambulatory Visit: Payer: Self-pay | Admitting: Surgery

## 2020-03-05 DIAGNOSIS — N6091 Unspecified benign mammary dysplasia of right breast: Secondary | ICD-10-CM

## 2020-03-18 ENCOUNTER — Encounter (HOSPITAL_BASED_OUTPATIENT_CLINIC_OR_DEPARTMENT_OTHER): Payer: Self-pay | Admitting: Surgery

## 2020-03-18 ENCOUNTER — Other Ambulatory Visit: Payer: Self-pay

## 2020-03-19 NOTE — Progress Notes (Signed)
Chart reviewed with Dr. Valma Cava.  Will proceed with surgery as scheduled without any further clearance/testing needed.

## 2020-03-22 ENCOUNTER — Other Ambulatory Visit (HOSPITAL_COMMUNITY)
Admission: RE | Admit: 2020-03-22 | Discharge: 2020-03-22 | Disposition: A | Discharge status: Home / Self Care | Payer: BC Managed Care – PPO | Source: Ambulatory Visit | Attending: Surgery | Admitting: Surgery

## 2020-03-22 DIAGNOSIS — Z01812 Encounter for preprocedural laboratory examination: Secondary | ICD-10-CM | POA: Insufficient documentation

## 2020-03-22 DIAGNOSIS — Z20822 Contact with and (suspected) exposure to covid-19: Secondary | ICD-10-CM | POA: Insufficient documentation

## 2020-03-22 LAB — SARS CORONAVIRUS 2 (TAT 6-24 HRS): SARS Coronavirus 2: NEGATIVE

## 2020-03-22 MED ORDER — ENSURE PRE-SURGERY PO LIQD: 296.0000 mL | Freq: Once | ORAL | Status: DC

## 2020-03-22 NOTE — Progress Notes (Signed)

## 2020-03-24 ENCOUNTER — Ambulatory Visit
Admission: RE | Admit: 2020-03-24 | Discharge: 2020-03-24 | Disposition: A | Discharge status: Home / Self Care | Payer: BC Managed Care – PPO | Source: Ambulatory Visit | Attending: Surgery | Admitting: Surgery

## 2020-03-24 ENCOUNTER — Other Ambulatory Visit: Payer: Self-pay

## 2020-03-24 DIAGNOSIS — N6091 Unspecified benign mammary dysplasia of right breast: Secondary | ICD-10-CM

## 2020-03-24 NOTE — H&P (Signed)
   [name]  Location: [facility] Patient #: [id number] DOB: 08/04/1971 Single / Language: Cleophus Molt / Race: Black or African American Female   History of Present Illness The patient is a 49 year old female who presents with a complaint of Breast problems. Chief complaint: Atypical lobular hyperplasia of the right breast  This is a 49 year old female who was recently found to have abnormal calcifications in the upper outer quadrant of the right breast on screening mammography. She has a strong family history of breast cancer in her grandmother and multiple sisters of her grandmother. She herself has a history of thyroid cancer. She underwent biopsy of the calcifications showing atypical lobular hyperplasia. She is otherwise without complaints. She has no nipple discharge. She has had no previous problems with her breasts or need for biopsies in the past. She has no cardiopulmonary problems.   Allergies (Chanel Teressa Senter, CMA;  Tree nuts (walnuts, cashews)  Allergies Reconciled   Medication History (Chanel Nolan, CMA) lamoTRIgine (100MG  Tablet, Oral) Active. Levothyroxine Sodium (112MCG Tablet, Oral) Active. Medications Reconciled  Vitals (Chanel Nolan CMA;  Weight: 185.5 lb Height: 67in Body Surface Area: 1.96 m Body Mass Index: 29.05 kg/m  Temp.: 97.63F  Pulse: 83 (Regular)  BP: 126/76(Sitting, Left Arm, Standard)     Physical Exam The physical exam findings are as follows: Note: On examination, she is well-appearing appearance.  There are no palpable breast masses or axillary adenopathy. 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 I reviewed her mammograms and ultrasound. I also reviewed her pathology results. I gave her a copy of the pathology results. We discussed the findings of atypical  lobular hyperplasia of the right breast. It is recommended that she undergo a radioactive seed guided right breast lumpectomy for complete histologic evaluation of this area to raw malignancy is in the findings as well as her strong family history of breast cancer. We discussed this in detail. I discussed the surgical procedure in detail. I discussed the risks which includes but is not limited to bleeding, infection, injury to surrounding structures, the need for further surgery of malignancy is found, cardiopulmonary issues, postoperative recovery, etc. She understands and wishes to proceed with surgery as soon as possible. This patient encounter took 30 minutes today to perform the following: take history, perform exam, review outside records, interpret imaging, counsel the patient on their diagnosis and document encounter, findings & plan in the EHR

## 2020-03-25 ENCOUNTER — Ambulatory Visit
Admission: RE | Admit: 2020-03-25 | Discharge: 2020-03-25 | Disposition: A | Discharge status: Home / Self Care | Payer: BC Managed Care – PPO | Source: Ambulatory Visit | Attending: Surgery | Admitting: Surgery

## 2020-03-25 ENCOUNTER — Other Ambulatory Visit: Payer: Self-pay

## 2020-03-25 ENCOUNTER — Ambulatory Visit (HOSPITAL_BASED_OUTPATIENT_CLINIC_OR_DEPARTMENT_OTHER): Payer: BC Managed Care – PPO | Admitting: Certified Registered"

## 2020-03-25 ENCOUNTER — Ambulatory Visit (HOSPITAL_BASED_OUTPATIENT_CLINIC_OR_DEPARTMENT_OTHER)
Admission: RE | Admit: 2020-03-25 | Discharge: 2020-03-25 | Disposition: A | Discharge status: Home / Self Care | Payer: BC Managed Care – PPO | Attending: Surgery | Admitting: Surgery

## 2020-03-25 ENCOUNTER — Encounter (HOSPITAL_BASED_OUTPATIENT_CLINIC_OR_DEPARTMENT_OTHER): Payer: Self-pay | Admitting: Surgery

## 2020-03-25 ENCOUNTER — Encounter (HOSPITAL_BASED_OUTPATIENT_CLINIC_OR_DEPARTMENT_OTHER)
Admission: RE | Disposition: A | Discharge status: Home / Self Care | Payer: Self-pay | Source: Home / Self Care | Attending: Surgery

## 2020-03-25 DIAGNOSIS — E0590 Thyrotoxicosis, unspecified without thyrotoxic crisis or storm: Secondary | ICD-10-CM | POA: Diagnosis not present

## 2020-03-25 DIAGNOSIS — N6091 Unspecified benign mammary dysplasia of right breast: Secondary | ICD-10-CM

## 2020-03-25 DIAGNOSIS — Z85850 Personal history of malignant neoplasm of thyroid: Secondary | ICD-10-CM | POA: Insufficient documentation

## 2020-03-25 DIAGNOSIS — Z803 Family history of malignant neoplasm of breast: Secondary | ICD-10-CM | POA: Insufficient documentation

## 2020-03-25 DIAGNOSIS — Z79890 Hormone replacement therapy (postmenopausal): Secondary | ICD-10-CM | POA: Insufficient documentation

## 2020-03-25 HISTORY — PX: BREAST LUMPECTOMY WITH RADIOACTIVE SEED LOCALIZATION: SHX6424

## 2020-03-25 SURGERY — BREAST LUMPECTOMY WITH RADIOACTIVE SEED LOCALIZATION
Anesthesia: General | Site: Breast | Laterality: Right

## 2020-03-25 MED ORDER — CHLORHEXIDINE GLUCONATE CLOTH 2 % EX PADS: 6.0000 | MEDICATED_PAD | Freq: Once | CUTANEOUS | Status: DC

## 2020-03-25 MED ORDER — DEXAMETHASONE SODIUM PHOSPHATE 10 MG/ML IJ SOLN
INTRAMUSCULAR | Status: DC | PRN
  Administered 2020-03-25: 5 mg via INTRAVENOUS

## 2020-03-25 MED ORDER — FENTANYL CITRATE (PF) 100 MCG/2ML IJ SOLN
INTRAMUSCULAR | Status: AC
  Filled 2020-03-25: qty 2

## 2020-03-25 MED ORDER — ONDANSETRON HCL 4 MG/2ML IJ SOLN
INTRAMUSCULAR | Status: DC | PRN
  Administered 2020-03-25 (×2): 4 mg via INTRAVENOUS

## 2020-03-25 MED ORDER — CELECOXIB 200 MG PO CAPS
ORAL_CAPSULE | ORAL | Status: AC
  Filled 2020-03-25: qty 2

## 2020-03-25 MED ORDER — OXYCODONE HCL 5 MG PO TABS
ORAL_TABLET | ORAL | Status: AC
  Filled 2020-03-25: qty 1

## 2020-03-25 MED ORDER — GABAPENTIN 300 MG PO CAPS
300.0000 mg | ORAL_CAPSULE | ORAL | Status: AC
  Administered 2020-03-25: 300 mg via ORAL

## 2020-03-25 MED ORDER — LIDOCAINE 2% (20 MG/ML) 5 ML SYRINGE
INTRAMUSCULAR | Status: DC | PRN
  Administered 2020-03-25: 80 mg via INTRAVENOUS

## 2020-03-25 MED ORDER — MIDAZOLAM HCL 5 MG/5ML IJ SOLN
INTRAMUSCULAR | Status: DC | PRN
  Administered 2020-03-25: 2 mg via INTRAVENOUS

## 2020-03-25 MED ORDER — ACETAMINOPHEN 500 MG PO TABS
1000.0000 mg | ORAL_TABLET | ORAL | Status: AC
  Administered 2020-03-25: 1000 mg via ORAL

## 2020-03-25 MED ORDER — GABAPENTIN 300 MG PO CAPS
ORAL_CAPSULE | ORAL | Status: AC
  Filled 2020-03-25: qty 1

## 2020-03-25 MED ORDER — ONDANSETRON HCL 4 MG/2ML IJ SOLN
INTRAMUSCULAR | Status: AC
  Filled 2020-03-25: qty 4

## 2020-03-25 MED ORDER — FENTANYL CITRATE (PF) 100 MCG/2ML IJ SOLN
25.0000 ug | INTRAMUSCULAR | Status: DC | PRN
  Administered 2020-03-25: 50 ug via INTRAVENOUS

## 2020-03-25 MED ORDER — CEFAZOLIN SODIUM-DEXTROSE 2-4 GM/100ML-% IV SOLN
2.0000 g | INTRAVENOUS | Status: AC
  Administered 2020-03-25: 2 g via INTRAVENOUS

## 2020-03-25 MED ORDER — FENTANYL CITRATE (PF) 100 MCG/2ML IJ SOLN: 25.0000 ug | INTRAMUSCULAR | Status: DC | PRN

## 2020-03-25 MED ORDER — OXYCODONE HCL 5 MG PO TABS: 5.0000 mg | ORAL_TABLET | Freq: Four times a day (QID) | ORAL | 0 refills | Status: DC | PRN

## 2020-03-25 MED ORDER — LACTATED RINGERS IV SOLN: INTRAVENOUS | Status: DC

## 2020-03-25 MED ORDER — MIDAZOLAM HCL 2 MG/2ML IJ SOLN
INTRAMUSCULAR | Status: AC
  Filled 2020-03-25: qty 2

## 2020-03-25 MED ORDER — FENTANYL CITRATE (PF) 100 MCG/2ML IJ SOLN
INTRAMUSCULAR | Status: DC | PRN
  Administered 2020-03-25: 25 ug via INTRAVENOUS
  Administered 2020-03-25: 50 ug via INTRAVENOUS
  Administered 2020-03-25: 25 ug via INTRAVENOUS

## 2020-03-25 MED ORDER — PROPOFOL 10 MG/ML IV BOLUS
INTRAVENOUS | Status: DC | PRN
  Administered 2020-03-25: 200 mg via INTRAVENOUS

## 2020-03-25 MED ORDER — CEFAZOLIN SODIUM-DEXTROSE 2-4 GM/100ML-% IV SOLN
INTRAVENOUS | Status: AC
  Filled 2020-03-25: qty 100

## 2020-03-25 MED ORDER — ACETAMINOPHEN 500 MG PO TABS
ORAL_TABLET | ORAL | Status: AC
  Filled 2020-03-25: qty 2

## 2020-03-25 MED ORDER — OXYCODONE HCL 5 MG PO TABS
5.0000 mg | ORAL_TABLET | Freq: Once | ORAL | Status: AC
  Administered 2020-03-25: 5 mg via ORAL

## 2020-03-25 MED ORDER — DEXAMETHASONE SODIUM PHOSPHATE 10 MG/ML IJ SOLN
INTRAMUSCULAR | Status: AC
  Filled 2020-03-25: qty 1

## 2020-03-25 MED ORDER — CELECOXIB 200 MG PO CAPS
400.0000 mg | ORAL_CAPSULE | ORAL | Status: AC
  Administered 2020-03-25: 400 mg via ORAL

## 2020-03-25 MED ORDER — LIDOCAINE 2% (20 MG/ML) 5 ML SYRINGE
INTRAMUSCULAR | Status: AC
  Filled 2020-03-25: qty 5

## 2020-03-25 MED ORDER — BUPIVACAINE HCL (PF) 0.25 % IJ SOLN
INTRAMUSCULAR | Status: DC | PRN
  Administered 2020-03-25: 20 mL

## 2020-03-25 SURGICAL SUPPLY — 39 items
ADH SKN CLS APL DERMABOND .7 (GAUZE/BANDAGES/DRESSINGS) ×1
APL PRP STRL LF DISP 70% ISPRP (MISCELLANEOUS) ×1
BINDER BREAST XLRG (GAUZE/BANDAGES/DRESSINGS) IMPLANT
BINDER BREAST XXLRG (GAUZE/BANDAGES/DRESSINGS) IMPLANT
BLADE SURG 15 STRL LF DISP TIS (BLADE) ×1 IMPLANT
BLADE SURG 15 STRL SS (BLADE) ×2
CHLORAPREP W/TINT 26 (MISCELLANEOUS) ×2 IMPLANT
COVER BACK TABLE 60X90IN (DRAPES) ×2 IMPLANT
COVER MAYO STAND STRL (DRAPES) ×2 IMPLANT
COVER PROBE W GEL 5X96 (DRAPES) ×2 IMPLANT
DERMABOND ADVANCED (GAUZE/BANDAGES/DRESSINGS) ×1
DERMABOND ADVANCED .7 DNX12 (GAUZE/BANDAGES/DRESSINGS) ×1 IMPLANT
DRAPE LAPAROSCOPIC ABDOMINAL (DRAPES) ×2 IMPLANT
DRAPE UTILITY XL STRL (DRAPES) ×2 IMPLANT
ELECT REM PT RETURN 9FT ADLT (ELECTROSURGICAL) ×2
ELECTRODE REM PT RTRN 9FT ADLT (ELECTROSURGICAL) ×1 IMPLANT
GLOVE BIO SURGEON STRL SZ 6.5 (GLOVE) ×1 IMPLANT
GLOVE SURG SIGNA 7.5 PF LTX (GLOVE) ×2 IMPLANT
GOWN STRL REUS W/ TWL LRG LVL3 (GOWN DISPOSABLE) ×1 IMPLANT
GOWN STRL REUS W/ TWL XL LVL3 (GOWN DISPOSABLE) ×1 IMPLANT
GOWN STRL REUS W/TWL LRG LVL3 (GOWN DISPOSABLE) ×2
GOWN STRL REUS W/TWL XL LVL3 (GOWN DISPOSABLE) ×2
KIT MARKER MARGIN INK (KITS) ×2 IMPLANT
NDL HYPO 25X1 1.5 SAFETY (NEEDLE) ×1 IMPLANT
NEEDLE HYPO 25X1 1.5 SAFETY (NEEDLE) ×2 IMPLANT
NS IRRIG 1000ML POUR BTL (IV SOLUTION) IMPLANT
PENCIL SMOKE EVACUATOR (MISCELLANEOUS) ×2 IMPLANT
SET BASIN DAY SURGERY F.S. (CUSTOM PROCEDURE TRAY) ×2 IMPLANT
SLEEVE SCD COMPRESS KNEE MED (MISCELLANEOUS) ×2 IMPLANT
SPONGE LAP 4X18 RFD (DISPOSABLE) ×2 IMPLANT
SUT MNCRL AB 4-0 PS2 18 (SUTURE) ×2 IMPLANT
SUT SILK 2 0 SH (SUTURE) IMPLANT
SUT VIC AB 3-0 SH 27 (SUTURE) ×2
SUT VIC AB 3-0 SH 27X BRD (SUTURE) ×1 IMPLANT
SYR CONTROL 10ML LL (SYRINGE) ×2 IMPLANT
TOWEL GREEN STERILE FF (TOWEL DISPOSABLE) ×2 IMPLANT
TRAY FAXITRON CT DISP (TRAY / TRAY PROCEDURE) ×2 IMPLANT
TUBE CONNECTING 20X1/4 (TUBING) IMPLANT
YANKAUER SUCT BULB TIP NO VENT (SUCTIONS) IMPLANT

## 2020-03-25 NOTE — Op Note (Signed)
RIGHT BREAST LUMPECTOMY WITH RADIOACTIVE SEED LOCALIZATION  Procedure Note  Tammy Nichols 03/25/2020   Pre-op Diagnosis: ATYPICAL LOBULAR HYPERPLASIA RIGHT BREAST     Post-op Diagnosis: same  Procedure(s): RIGHT BREAST LUMPECTOMY WITH RADIOACTIVE SEED LOCALIZATION  Surgeon(s): Coralie Keens, MD  Anesthesia: General  Staff:  Circulator: Izora Ribas, RN Scrub Person: Lorenza Burton, CST  Estimated Blood Loss: Minimal               Specimens: sent to path  Indications: This is a 49 year old female who was found to have an abnormality in the right breast on screening mammography.  She underwent a stereotactic biopsy showing atypical lobular hyperplasia.  The decision was made to proceed with a formal lumpectomy to remove this area for histologic evaluation  Procedure: The patient was brought to the operating room identifies correct patient.  She is placed upon the operating room table and general anesthesia was induced.  Her right breast was then prepped and draped in usual sterile fashion.  Using the neoprobe, I identified the radioactive seed in the upper outer quadrant of the breast.  I anesthetized the skin at the lateral edge of the areola of the right breast with Marcaine.  I then made a circumareolar incision with a scalpel.  I then dissected down to the deep breast tissue with electrocautery and performed a formal lumpectomy with the aid of the cautery staying around the radioactive seed removing the tissue in the vertical plane as well.  Once the lumpectomy was completed, I marked the margins with paint, and x-rayed the specimen.  This confirmed that the radioactive seed and previous tissue marker were in the specimen.  The lumpectomy specimen was then sent to pathology for evaluation.  I achieved hemostasis with cautery.  I then closed the subcutaneous tissue with interrupted 3-0 Vicryl sutures and closed the skin with a running 4-0 Monocryl.  Dermabond was then applied.   The patient tolerated the procedure well.  All the counts were correct at the end of the procedure.  The patient was then extubated in the operating room and taken in a stable condition to the recovery room.          Coralie Keens   Date: 03/25/2020  Time: 10:18 AM

## 2020-03-25 NOTE — Anesthesia Postprocedure Evaluation (Signed)
Anesthesia Post Note  Patient: Tammy Nichols  Procedure(s) Performed: RIGHT BREAST LUMPECTOMY WITH RADIOACTIVE SEED LOCALIZATION (Right Breast)     Patient location during evaluation: PACU Anesthesia Type: General Level of consciousness: awake Pain management: pain level controlled    Last Vitals:  Vitals:   03/25/20 1208 03/25/20 1300  BP:  126/81  Pulse: 61 64  Resp: 16 16  Temp:  36.6 C  SpO2: 100% 100%    Last Pain:  Vitals:   03/25/20 1300  TempSrc:   PainSc: 8                  Janiel Crisostomo

## 2020-03-25 NOTE — Anesthesia Preprocedure Evaluation (Signed)
Anesthesia Evaluation  Patient identified by MRN, date of birth, ID band Patient awake    Reviewed: Allergy & Precautions, NPO status , Patient's Chart, lab work & pertinent test results  Airway Mallampati: II  TM Distance: >3 FB     Dental   Pulmonary    breath sounds clear to auscultation       Cardiovascular negative cardio ROS   Rhythm:Regular Rate:Normal     Neuro/Psych    GI/Hepatic negative GI ROS, Neg liver ROS,   Endo/Other  Hyperthyroidism   Renal/GU negative Renal ROS     Musculoskeletal   Abdominal   Peds  Hematology   Anesthesia Other Findings   Reproductive/Obstetrics                             Anesthesia Physical Anesthesia Plan  ASA: II  Anesthesia Plan: General   Post-op Pain Management:    Induction: Intravenous  PONV Risk Score and Plan: 3 and Ondansetron, Dexamethasone and Midazolam  Airway Management Planned: LMA  Additional Equipment:   Intra-op Plan:   Post-operative Plan:   Informed Consent: I have reviewed the patients History and Physical, chart, labs and discussed the procedure including the risks, benefits and alternatives for the proposed anesthesia with the patient or authorized representative who has indicated his/her understanding and acceptance.       Plan Discussed with: CRNA and Anesthesiologist  Anesthesia Plan Comments:         Anesthesia Quick Evaluation

## 2020-03-25 NOTE — Transfer of Care (Signed)
Immediate Anesthesia Transfer of Care Note  Patient: Tammy Nichols  Procedure(s) Performed: RIGHT BREAST LUMPECTOMY WITH RADIOACTIVE SEED LOCALIZATION (Right Breast)  Patient Location: PACU  Anesthesia Type:General  Level of Consciousness: drowsy  Airway & Oxygen Therapy: Patient Spontanous Breathing and Patient connected to face mask oxygen  Post-op Assessment: Report given to RN and Post -op Vital signs reviewed and stable  Post vital signs: Reviewed and stable  Last Vitals:  Vitals Value Taken Time  BP 98/67 03/25/20 1026  Temp    Pulse 65 03/25/20 1028  Resp 11 03/25/20 1028  SpO2 100 % 03/25/20 1028  Vitals shown include unvalidated device data.  Last Pain:  Vitals:   03/25/20 0815  TempSrc: Oral  PainSc: 0-No pain         Complications: No apparent anesthesia complications

## 2020-03-25 NOTE — Discharge Instructions (Signed)
°Post Anesthesia Home Care Instructions ° °Activity: °Get plenty of rest for the remainder of the day. A responsible individual must stay with you for 24 hours following the procedure.  °For the next 24 hours, DO NOT: °-Drive a car °-Operate machinery °-Drink alcoholic beverages °-Take any medication unless instructed by your physician °-Make any legal decisions or sign important papers. ° °Meals: °Start with liquid foods such as gelatin or soup. Progress to regular foods as tolerated. Avoid greasy, spicy, heavy foods. If nausea and/or vomiting occur, drink only clear liquids until the nausea and/or vomiting subsides. Call your physician if vomiting continues. ° °Special Instructions/Symptoms: °Your throat may feel dry or sore from the anesthesia or the breathing tube placed in your throat during surgery. If this causes discomfort, gargle with warm salt water. The discomfort should disappear within 24 hours. ° °If you had a scopolamine patch placed behind your ear for the management of post- operative nausea and/or vomiting: ° °1. The medication in the patch is effective for 72 hours, after which it should be removed.  Wrap patch in a tissue and discard in the trash. Wash hands thoroughly with soap and water. °2. You may remove the patch earlier than 72 hours if you experience unpleasant side effects which may include dry mouth, dizziness or visual disturbances. °3. Avoid touching the patch. Wash your hands with soap and water after contact with the patch. °   °[facility] °Office Phone Number [phone] ° °BREAST BIOPSY/ PARTIAL MASTECTOMY: POST OP INSTRUCTIONS ° °Always review your discharge instruction sheet given to you by the facility where your surgery was performed. ° °IF YOU HAVE DISABILITY OR FAMILY LEAVE FORMS, YOU MUST BRING THEM TO THE OFFICE FOR PROCESSING.  DO NOT GIVE THEM TO YOUR DOCTOR. ° °1. A prescription for pain medication may be given to you upon discharge.  Take your pain  medication as prescribed, if needed.  If narcotic pain medicine is not needed, then you may take acetaminophen (Tylenol) or ibuprofen (Advil) as needed. °2. Take your usually prescribed medications unless otherwise directed °3. If you need a refill on your pain medication, please contact your pharmacy.  They will contact our office to request authorization.  Prescriptions will not be filled after 5pm or on week-ends. °4. You should eat very light the first 24 hours after surgery, such as soup, crackers, pudding, etc.  Resume your normal diet the day after surgery. °5. Most patients will experience some swelling and bruising in the breast.  Ice packs and a good support bra will help.  Swelling and bruising can take several days to resolve.  °6. It is common to experience some constipation if taking pain medication after surgery.  Increasing fluid intake and taking a stool softener will usually help or prevent this problem from occurring.  A mild laxative (Milk of Magnesia or Miralax) should be taken according to package directions if there are no bowel movements after 48 hours. °7. Unless discharge instructions indicate otherwise, you may remove your bandages 24-48 hours after surgery, and you may shower at that time.  You may have steri-strips (small skin tapes) in place directly over the incision.  These strips should be left on the skin for 7-10 days.  If your surgeon used skin glue on the incision, you may shower in 24 hours.  The glue will flake off over the next 2-3 weeks.  Any sutures or staples will be removed at the office during your follow-up visit. °8. ACTIVITIES:    You may resume regular daily activities (gradually increasing) beginning the next day.  Wearing a good support bra or sports bra minimizes pain and swelling.  You may have sexual intercourse when it is comfortable. a. You may drive when you no longer are taking prescription pain medication, you can comfortably wear a seatbelt, and you can  safely maneuver your car and apply brakes. b. RETURN TO WORK:  ______________________________________________________________________________________ 9. You should see your doctor in the office for a follow-up appointment approximately two weeks after your surgery.  Your doctors nurse will typically make your follow-up appointment when she calls you with your pathology report.  Expect your pathology report 2-3 business days after your surgery.  You may call to check if you do not hear from [place] after three days. 10. OTHER INSTRUCTIONS: OK TO REMOVE BINDER AND SHOWER TOMORROW 11. ICE PACK, TYLENOL, AND IBUPROFEN ALSO FOR PAIN 12. NO VIGOROUS ACTIVITY FOR ONE WEEK _______________________________________________________________________________________________ _____________________________________________________________________________________________________________________________________ _____________________________________________________________________________________________________________________________________ _____________________________________________________________________________________________________________________________________  WHEN TO CALL YOUR DOCTOR: 1. Fever over 101.0 2. Nausea and/or vomiting. 3. Extreme swelling or bruising. 4. Continued bleeding from incision. 5. Increased pain, redness, or drainage from the incision.  The clinic staff is available to answer your questions during regular business hours.  Please dont hesitate to call and ask to speak to one of the nurses for clinical concerns.  If you have a medical emergency, go to the nearest emergency room or call 911.  A surgeon from [facility] is always on call at the hospital.  For further questions, please visit [email or web address]

## 2020-03-25 NOTE — Anesthesia Procedure Notes (Signed)
Procedure Name: LMA Insertion Date/Time: 03/25/2020 9:45 AM Performed by: Gwyndolyn Saxon, CRNA Pre-anesthesia Checklist: Patient identified, Emergency Drugs available, Suction available and Patient being monitored Patient Re-evaluated:Patient Re-evaluated prior to induction Oxygen Delivery Method: Circle system utilized Preoxygenation: Pre-oxygenation with 100% oxygen Induction Type: IV induction Ventilation: Mask ventilation without difficulty LMA: LMA inserted LMA Size: 4.0 Number of attempts: 1 Placement Confirmation: positive ETCO2 and breath sounds checked- equal and bilateral Tube secured with: Tape Dental Injury: Teeth and Oropharynx as per pre-operative assessment

## 2020-03-25 NOTE — Interval H&P Note (Signed)
History and Physical Interval Note: no change in H and P  03/25/2020 8:09 AM  [name]  has presented today for surgery, with the diagnosis of ATYPICAL LOBULAR HYPERPLASIA RIGHT BREAST.  The various methods of treatment have been discussed with the patient and family. After consideration of risks, benefits and other options for treatment, the patient has consented to  Procedure(s) with comments: RIGHT BREAST LUMPECTOMY WITH RADIOACTIVE SEED LOCALIZATION (Right) - LMA as a surgical intervention.  The patient's history has been reviewed, patient examined, no change in status, stable for surgery.  I have reviewed the patient's chart and labs.  Questions were answered to the patient's satisfaction.     Tammy Nichols

## 2020-03-26 LAB — SURGICAL PATHOLOGY

## 2020-03-29 ENCOUNTER — Encounter: Payer: Self-pay | Admitting: *Deleted

## 2020-11-25 ENCOUNTER — Other Ambulatory Visit: Payer: Self-pay | Admitting: Internal Medicine

## 2020-11-26 LAB — COMPLETE METABOLIC PANEL WITH GFR
AG Ratio: 1.8 (calc) (ref 1.0–2.5)
ALT: 12 U/L (ref 6–29)
AST: 14 U/L (ref 10–35)
Albumin: 4.3 g/dL (ref 3.6–5.1)
Alkaline phosphatase (APISO): 102 U/L (ref 31–125)
BUN/Creatinine Ratio: 40 (calc) — ABNORMAL HIGH (ref 6–22)
BUN: 27 mg/dL — ABNORMAL HIGH (ref 7–25)
CO2: 25 mmol/L (ref 20–32)
Calcium: 9.3 mg/dL (ref 8.6–10.2)
Chloride: 107 mmol/L (ref 98–110)
Creat: 0.68 mg/dL (ref 0.50–1.10)
GFR, Est African American: 119 mL/min/{1.73_m2} (ref >=60)
GFR, Est Non African American: 103 mL/min/{1.73_m2} (ref >=60)
Globulin: 2.4 g/dL (calc) (ref 1.9–3.7)
Glucose, Bld: 95 mg/dL (ref 65–99)
Potassium: 4.8 mmol/L (ref 3.5–5.3)
Sodium: 140 mmol/L (ref 135–146)
Total Bilirubin: 0.3 mg/dL (ref 0.2–1.2)
Total Protein: 6.7 g/dL (ref 6.1–8.1)

## 2020-11-26 LAB — LIPID PANEL
Cholesterol: 154 mg/dL (ref <200)
HDL: 55 mg/dL (ref >=50)
LDL Cholesterol (Calc): 82 mg/dL (calc)
Non-HDL Cholesterol (Calc): 99 mg/dL (calc) (ref <130)
Total CHOL/HDL Ratio: 2.8 (calc) (ref <5.0)
Triglycerides: 87 mg/dL (ref <150)

## 2020-11-26 LAB — CBC
HCT: 37.9 % (ref 35.0–45.0)
Hemoglobin: 12.6 g/dL (ref 11.7–15.5)
MCH: 27.4 pg (ref 27.0–33.0)
MCHC: 33.2 g/dL (ref 32.0–36.0)
MCV: 82.4 fL (ref 80.0–100.0)
MPV: 9.3 fL (ref 7.5–12.5)
Platelets: 273 10*3/uL (ref 140–400)
RBC: 4.6 10*6/uL (ref 3.80–5.10)
RDW: 13 % (ref 11.0–15.0)
WBC: 7.7 10*3/uL (ref 3.8–10.8)

## 2020-11-26 LAB — VITAMIN D 25 HYDROXY (VIT D DEFICIENCY, FRACTURES): Vit D, 25-Hydroxy: 20 ng/mL — ABNORMAL LOW (ref 30–100)

## 2021-05-13 DIAGNOSIS — G40909 Epilepsy, unspecified, not intractable, without status epilepticus: Secondary | ICD-10-CM | POA: Insufficient documentation

## 2021-05-13 DIAGNOSIS — N6011 Diffuse cystic mastopathy of right breast: Secondary | ICD-10-CM | POA: Insufficient documentation

## 2021-05-14 ENCOUNTER — Encounter (HOSPITAL_COMMUNITY): Payer: Self-pay

## 2021-08-07 ENCOUNTER — Ambulatory Visit (HOSPITAL_COMMUNITY)
Admission: EM | Admit: 2021-08-07 | Discharge: 2021-08-07 | Disposition: A | Discharge status: Home / Self Care | Payer: BC Managed Care – PPO

## 2021-08-07 ENCOUNTER — Other Ambulatory Visit: Payer: Self-pay

## 2021-11-18 ENCOUNTER — Other Ambulatory Visit: Payer: Self-pay | Admitting: Internal Medicine

## 2021-11-18 DIAGNOSIS — C73 Malignant neoplasm of thyroid gland: Secondary | ICD-10-CM

## 2021-11-19 LAB — CBC
HCT: 42.4 % (ref 35.0–45.0)
Hemoglobin: 14.3 g/dL (ref 11.7–15.5)
MCH: 28.7 pg (ref 27.0–33.0)
MCHC: 33.7 g/dL (ref 32.0–36.0)
MCV: 85.1 fL (ref 80.0–100.0)
MPV: 9.2 fL (ref 7.5–12.5)
Platelets: 253 10*3/uL (ref 140–400)
RBC: 4.98 10*6/uL (ref 3.80–5.10)
RDW: 12.6 % (ref 11.0–15.0)
WBC: 5.3 10*3/uL (ref 3.8–10.8)

## 2021-11-19 LAB — LIPID PANEL
Cholesterol: 175 mg/dL (ref <200)
HDL: 57 mg/dL (ref >=50)
LDL Cholesterol (Calc): 93 mg/dL (calc)
Non-HDL Cholesterol (Calc): 118 mg/dL (calc) (ref <130)
Total CHOL/HDL Ratio: 3.1 (calc) (ref <5.0)
Triglycerides: 158 mg/dL — ABNORMAL HIGH (ref <150)

## 2021-11-19 LAB — COMPLETE METABOLIC PANEL WITH GFR
AG Ratio: 1.5 (calc) (ref 1.0–2.5)
ALT: 14 U/L (ref 6–29)
AST: 19 U/L (ref 10–35)
Albumin: 4.6 g/dL (ref 3.6–5.1)
Alkaline phosphatase (APISO): 106 U/L (ref 37–153)
BUN: 15 mg/dL (ref 7–25)
CO2: 26 mmol/L (ref 20–32)
Calcium: 9.7 mg/dL (ref 8.6–10.4)
Chloride: 107 mmol/L (ref 98–110)
Creat: 0.9 mg/dL (ref 0.50–1.03)
Globulin: 3.1 g/dL (calc) (ref 1.9–3.7)
Glucose, Bld: 90 mg/dL (ref 65–99)
Potassium: 4 mmol/L (ref 3.5–5.3)
Sodium: 144 mmol/L (ref 135–146)
Total Bilirubin: 0.2 mg/dL (ref 0.2–1.2)
Total Protein: 7.7 g/dL (ref 6.1–8.1)
eGFR: 78 mL/min/{1.73_m2} (ref >=60)

## 2021-11-19 LAB — VITAMIN D 25 HYDROXY (VIT D DEFICIENCY, FRACTURES): Vit D, 25-Hydroxy: 30 ng/mL (ref 30–100)

## 2021-11-19 LAB — TSH: TSH: 0.05 mIU/L — ABNORMAL LOW

## 2021-12-13 ENCOUNTER — Ambulatory Visit
Admission: RE | Admit: 2021-12-13 | Discharge: 2021-12-13 | Disposition: A | Discharge status: Home / Self Care | Payer: BC Managed Care – PPO | Source: Ambulatory Visit | Attending: Internal Medicine | Admitting: Internal Medicine

## 2021-12-13 DIAGNOSIS — C73 Malignant neoplasm of thyroid gland: Secondary | ICD-10-CM

## 2021-12-13 MED ORDER — IOPAMIDOL (ISOVUE-300) INJECTION 61%
80.0000 mL | Freq: Once | INTRAVENOUS | Status: AC | PRN
  Administered 2021-12-13: 80 mL via INTRAVENOUS

## 2022-02-03 ENCOUNTER — Emergency Department (HOSPITAL_BASED_OUTPATIENT_CLINIC_OR_DEPARTMENT_OTHER)
Admission: EM | Admit: 2022-02-03 | Discharge: 2022-02-03 | Disposition: A | Discharge status: Home / Self Care | Payer: BC Managed Care – PPO | Attending: Emergency Medicine | Admitting: Emergency Medicine

## 2022-02-03 ENCOUNTER — Other Ambulatory Visit: Payer: Self-pay

## 2022-02-03 ENCOUNTER — Encounter (HOSPITAL_BASED_OUTPATIENT_CLINIC_OR_DEPARTMENT_OTHER): Payer: Self-pay

## 2022-02-03 DIAGNOSIS — E039 Hypothyroidism, unspecified: Secondary | ICD-10-CM | POA: Insufficient documentation

## 2022-02-03 DIAGNOSIS — Z85850 Personal history of malignant neoplasm of thyroid: Secondary | ICD-10-CM | POA: Diagnosis not present

## 2022-02-03 DIAGNOSIS — Z79899 Other long term (current) drug therapy: Secondary | ICD-10-CM | POA: Insufficient documentation

## 2022-02-03 DIAGNOSIS — J209 Acute bronchitis, unspecified: Secondary | ICD-10-CM | POA: Diagnosis not present

## 2022-02-03 DIAGNOSIS — J029 Acute pharyngitis, unspecified: Secondary | ICD-10-CM | POA: Diagnosis present

## 2022-02-03 MED ORDER — ALBUTEROL SULFATE HFA 108 (90 BASE) MCG/ACT IN AERS
2.0000 | INHALATION_SPRAY | Freq: Once | RESPIRATORY_TRACT | Status: AC
  Administered 2022-02-03: 2 via RESPIRATORY_TRACT
  Filled 2022-02-03: qty 6.7

## 2022-02-03 MED ORDER — BENZONATATE 200 MG PO CAPS: 200.0000 mg | ORAL_CAPSULE | Freq: Three times a day (TID) | ORAL | 0 refills | Status: AC

## 2022-02-03 NOTE — ED Triage Notes (Signed)
First contact with patient. Patient arrived via triage from work with complaints of cough/congestion x Monday - Patient states she came because her school  nurse told her she heard crackles in her lungs. Pt is A&OX 4. Respirations even/unlabored - intermittent dry cough noted - speaking in full sentences. Patient placed on monitor and call light within reach. Patient updated on plan of care. Will continue to monitor patient.  ? ?

## 2022-02-03 NOTE — Discharge Instructions (Addendum)
Use albuterol inhaler 1 to 2 puffs every 4-6 hours as needed for cough. ?Additionally, a prescription for Tessalon has been sent to your pharmacy to help with your cough. ?Home to rest and hydrate.  Zyrtec and Flonase as directed.  Recheck with your doctor if symptoms continue. ?

## 2022-02-03 NOTE — ED Notes (Signed)
No acute distress noted upon this RN's departure of patient. Verified discharge paperwork with name and DOB. Vital signs stable. Patient taken to checkout window. Discharge paperwork discussed with patient - Patient given inhaler at discharge. No further questions voiced upon discharge.  ? ?

## 2022-02-03 NOTE — ED Provider Notes (Signed)
?[facility] EMERGENCY DEPARTMENT ?Provider Note ? ? ?CSN: [id number] ?Arrival date & time: 02/03/22  1146 ? ?  ? ?History ? ?Chief Complaint  ?Patient presents with  ? Cough  ? Nasal Congestion  ? ? ?Tammy Nichols is a 51 y.o. female. ? ?51 year old female presents with complaint of sore throat with cough.  Patient states her symptoms started on Monday with cough and congestion, max temp of 99.8.  Patient had been managing her symptoms at home, has had a cough which is occasionally productive with green to yellow sputum, states her sputum today has been clear.  Patient followed up with her nurse at work who told her to go to the emergency room due to hearing crackles in her lungs.  Patient denies specific complaint of shortness of breath or wheezing.  No history of asthma or chronic lung disease.  Is a non-smoker. ?Past medical history of seizures, iron deficiency, thyroid cancer (acquired hypothyroidism). ? ? ?  ? ?Home Medications ?Prior to Admission medications   ?Medication Sig Start Date End Date Taking? Authorizing Provider  ?benzonatate (TESSALON) 200 MG capsule Take 1 capsule (200 mg total) by mouth every 8 (eight) hours for 10 days. 02/03/22 02/13/22 Yes Tacy Learn, PA-C  ?APPLE CIDER VINEGAR PO Take by mouth.    [provider]  ?ASHWAGANDHA PO Take by mouth.    [provider]  ?lamoTRIgine (LAMICTAL) 100 MG tablet Take 100 mg by mouth daily.    [provider]  ?levothyroxine (SYNTHROID, LEVOTHROID) 112 MCG tablet Take 224 mcg by mouth daily. 07/04/16   [provider]  ?oxyCODONE (OXY IR/ROXICODONE) 5 MG immediate release tablet Take 1 tablet (5 mg total) by mouth every 6 (six) hours as needed for moderate pain, severe pain or breakthrough pain. 03/25/20   Coralie Keens, MD  ?   ? ?Allergies    ?Other and Soap   ? ?Review of Systems   ?Review of Systems ?Negative except as per HPI ?Physical Exam ?Updated Vital Signs ?BP 131/82   Pulse 86   Temp 98.1  ?F (36.7 ?C) (Oral)   Resp 18   Ht '5\' 7"'$  (1.702 m)   Wt 81.2 kg   LMP  (LMP Unknown) Comment: "more than 6 years ago"  SpO2 97%   BMI 28.04 kg/m?  ?Physical Exam ?Vitals and nursing note reviewed.  ?Constitutional:   ?   General: She is not in acute distress. ?   Appearance: She is well-developed. She is not diaphoretic.  ?HENT:  ?   Head: Normocephalic and atraumatic.  ?   Right Ear: Tympanic membrane and ear canal normal.  ?   Left Ear: Tympanic membrane and ear canal normal.  ?   Nose: Congestion present.  ?   Mouth/Throat:  ?   Mouth: Mucous membranes are moist.  ?Eyes:  ?   Conjunctiva/sclera: Conjunctivae normal.  ?Cardiovascular:  ?   Rate and Rhythm: Normal rate and regular rhythm.  ?   Heart sounds: Normal heart sounds.  ?Pulmonary:  ?   Effort: Pulmonary effort is normal.  ?   Breath sounds: Normal breath sounds.  ?Musculoskeletal:  ?   Cervical back: Neck supple.  ?Lymphadenopathy:  ?   Cervical: No cervical adenopathy.  ?Skin: ?   General: Skin is warm and dry.  ?   Findings: No erythema or rash.  ?Neurological:  ?   Mental Status: She is alert and oriented to person, place, and time.  ?Psychiatric:     ?  Behavior: Behavior normal.  ? ? ?ED Results / Procedures / Treatments   ?Labs ?(all labs ordered are listed, but only abnormal results are displayed) ?Labs Reviewed - No data to display ? ?EKG ?None ? ?Radiology ?No results found. ? ?Procedures ?Procedures  ? ? ?Medications Ordered in ED ?Medications  ?albuterol (VENTOLIN HFA) 108 (90 Base) MCG/ACT inhaler 2 puff (has no administration in time range)  ? ? ?ED Course/ Medical Decision Making/ A&P ?  ?                        ?Medical Decision Making ?Risk ?Prescription drug management. ? ? ?51 year old female with URI symptoms as above times 4 days.  Patient is well-appearing on exam, does have frequent dry cough.  Lungs are clear to auscultation, no wheezing noted.  Exam is otherwise reassuring.  Vitals reviewed, O2 sat 97% on room air.  Plan is  to treat with albuterol inhaler in the ED, discharged with prescription for Tessalon, continue albuterol.  Recommend Zyrtec and Flonase.  Recheck with PCP if symptoms persist. ? ? ? ? ? ? ? ?Final Clinical Impression(s) / ED Diagnoses ?Final diagnoses:  ?Acute bronchitis, unspecified organism  ? ? ?Rx / DC Orders ?ED Discharge Orders   ? ?      Ordered  ?  benzonatate (TESSALON) 200 MG capsule  Every 8 hours       ? 02/03/22 1227  ? ?  ?  ? ?  ? ? ?  ?Tacy Learn, PA-C ?02/03/22 1230 ? ?  ?Lianne Cure, DO ?77/82/42 0654 ? ?

## 2022-05-10 IMAGING — CT CT NECK W/ CM
4 of 5 series · 16 of 33 positions shown, 18 images · IV contrast (agent unspecified)
Comparison: None.

CLINICAL DATA: Papillary thyroid carcinoma 10-12 years ago. History
of thyroidectomy. Feels like something stuck in throat. Coughing at
night.

EXAM:
CT NECK WITH CONTRAST
TECHNIQUE: Multidetector CT imaging of the neck was performed using the
standard protocol following the bolus administration of intravenous
contrast.

[Series 15: neck 2.00 br40 s3 axial soft · axial · 0.47mm/px · z∈[-771,-621]mm · 4 of 125 slices shown]
[im 25/125  soft-tissue]
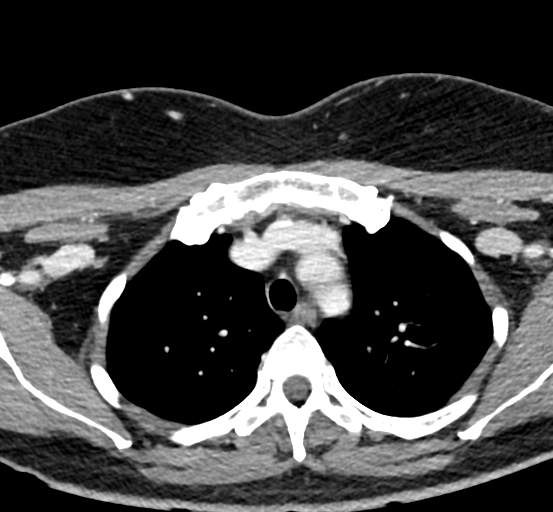
[im 50/125  soft-tissue]
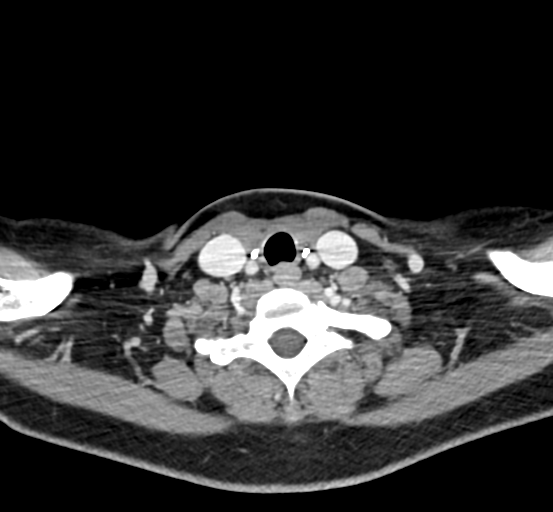
[im 75/125  soft-tissue]
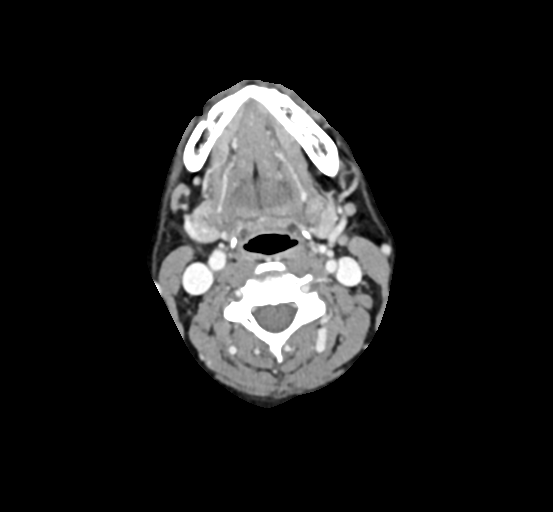
[im 100/125  soft-tissue]
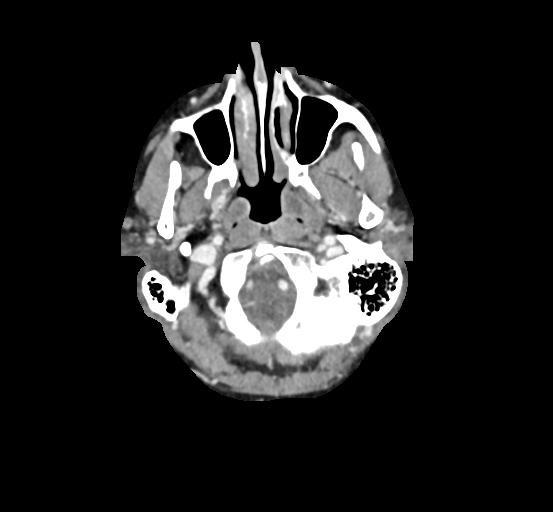

[Series 19: neck 2.00 br40 s3 angled axial (person_name) · axial · 0.47mm/px · z∈[-771,-621]mm · 4 of 125 slices shown, 5 images]
[im 25/125  soft-tissue]
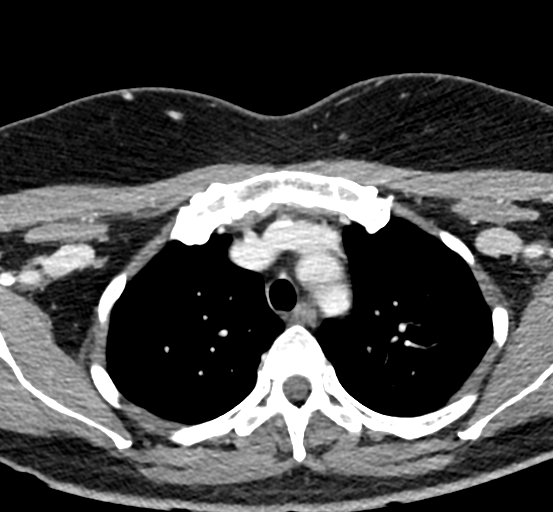
[im 25/125  bone]
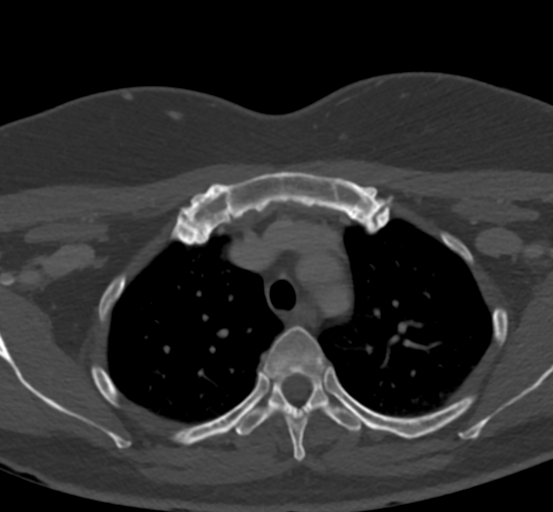
[im 50/125  bone]
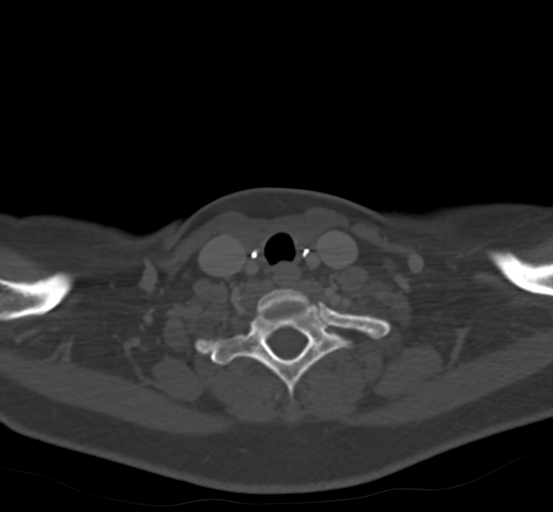
[im 75/125  bone]
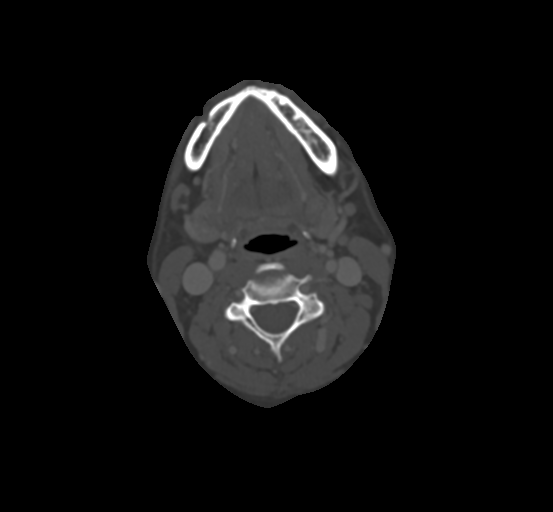
[im 100/125  bone]
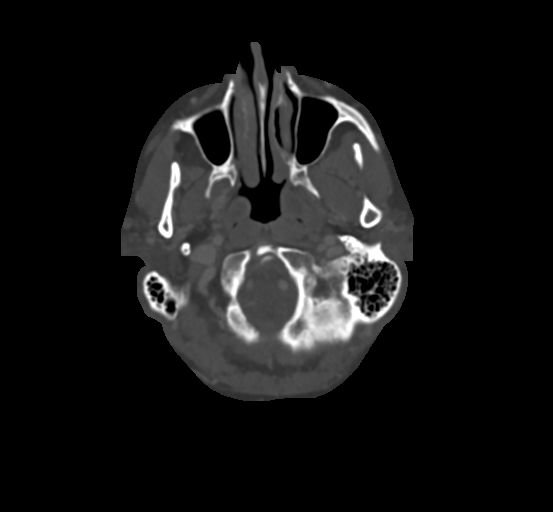

[Series 21: neck 2.00 br40 s3 angled (person_name) · coronal · 0.49mm/px · 3 of 120 slices shown]
[im 24/120  bone]
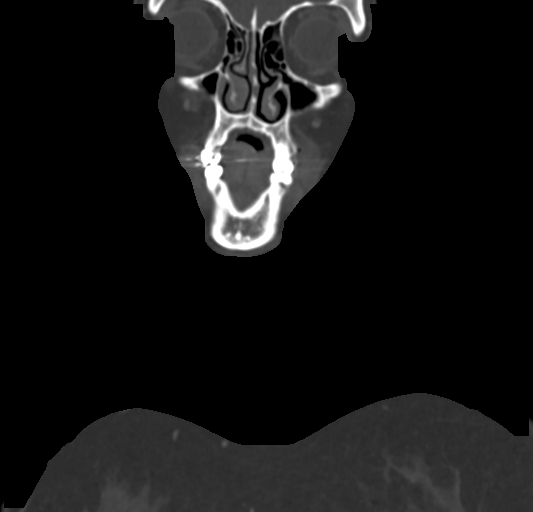
[im 48/120  bone]
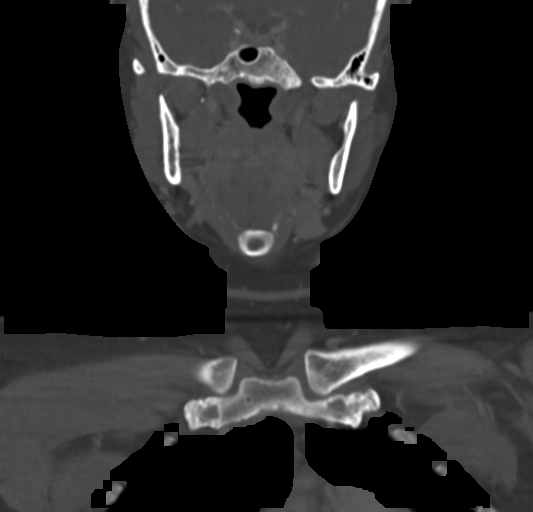
[im 72/120  bone]
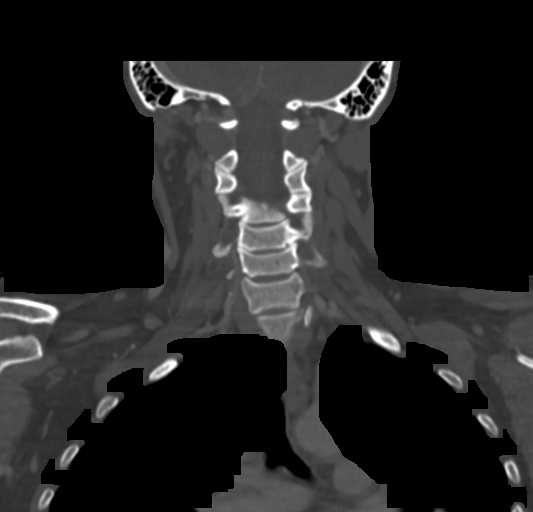

[Series 23: neck 2.00 br40 s3 sag (person_name) · sagittal · 0.47mm/px · 5 of 130 slices shown, 6 images]
[im 44/130  bone]
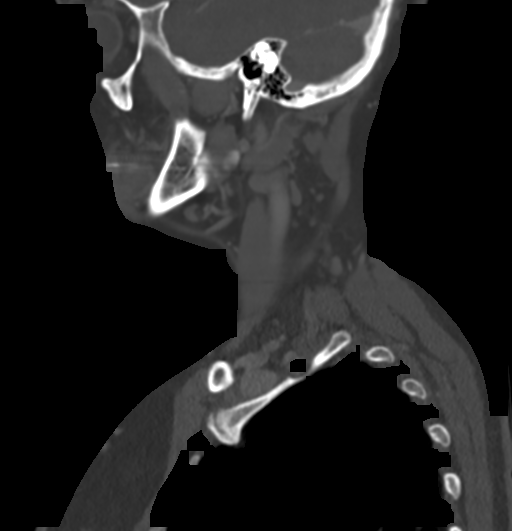
[im 54/130  bone]
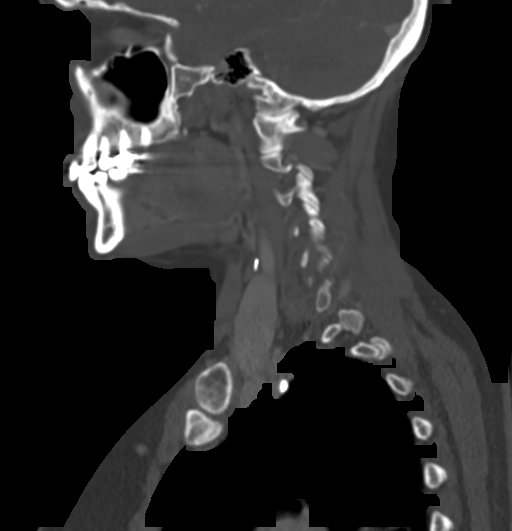
[im 65/130  soft-tissue]
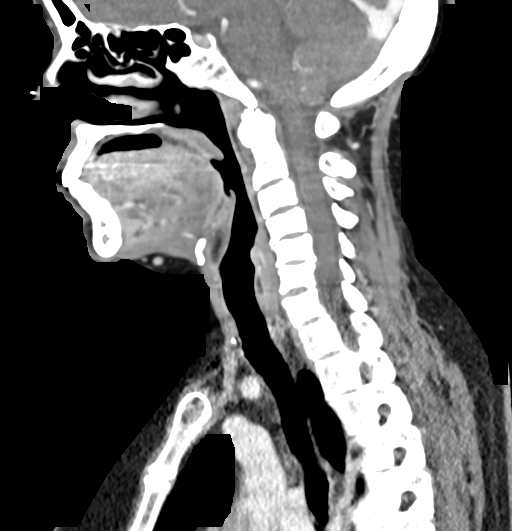
[im 65/130  bone]
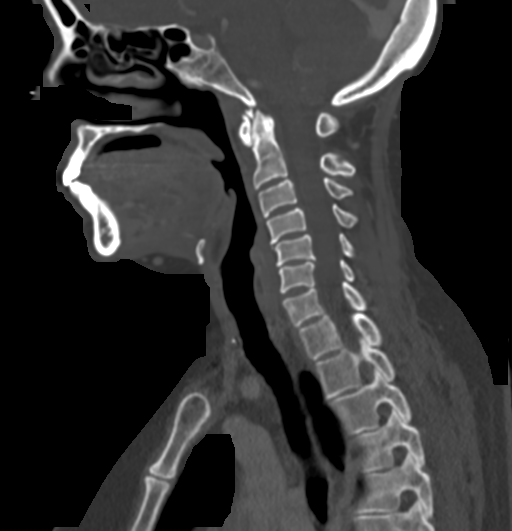
[im 76/130  bone]
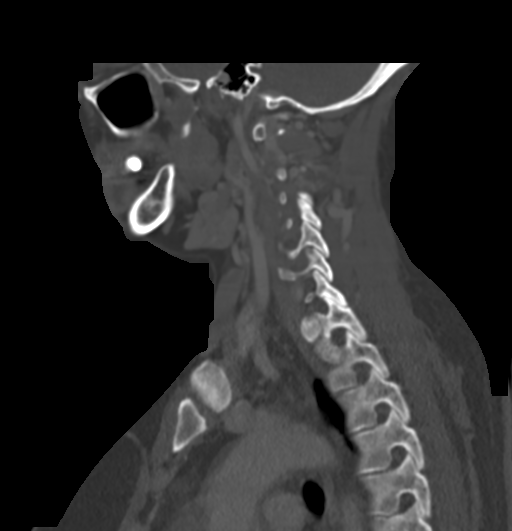
[im 87/130  bone]
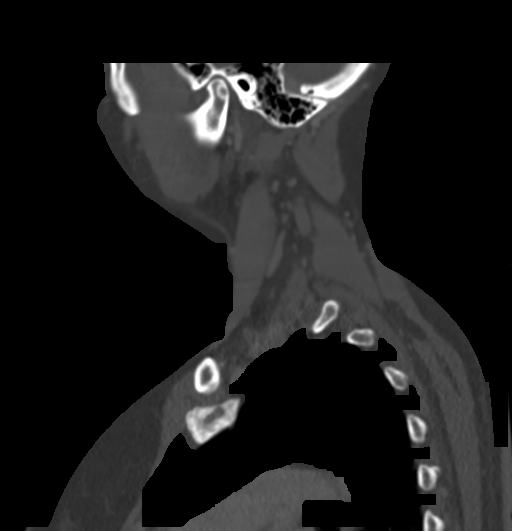

[16 of 33 positions shown; findings below may reference images not displayed]

RADIATION DOSE REDUCTION: This exam was performed according to the
departmental dose-optimization program which includes automated
exposure control, adjustment of the mA and/or kV according to
patient size and/or use of iterative reconstruction technique.

CONTRAST:  80mL HTM8IV-W88 IOPAMIDOL (HTM8IV-W88) INJECTION 61%
FINDINGS: Pharynx and larynx: Normal. No mass or swelling.

Salivary glands: No inflammation, mass, or stone.

Thyroid: Postop thyroidectomy. No recurrent mass in the thyroid bed.

Lymph nodes: No enlarged lymph nodes in the neck. Subcentimeter
level 2 lymph nodes bilaterally.

Vascular: Normal vascular enhancement

Limited intracranial: Negative

Visualized orbits: Negative

Mastoids and visualized paranasal sinuses: Paranasal sinuses clear.
Mastoid and middle ear clear bilaterally.

Skeleton: No acute skeletal abnormality.

Upper chest: Chest CT from today reported separately

Other: Unilateral accessory muscle in the left neck consistent with
levator claviculae. This is a small muscle which attaches to the
transverse processes of the upper cervical spine and attaches to the
mid left clavicle.
IMPRESSION: Negative for mass or adenopathy in the neck.  Postop thyroidectomy.

## 2022-07-31 ENCOUNTER — Other Ambulatory Visit: Payer: Self-pay | Admitting: Internal Medicine

## 2022-08-01 LAB — T3: T3, Total: 115 ng/dL (ref 76–181)

## 2022-08-01 LAB — EXTRA LAV TOP TUBE

## 2022-08-01 LAB — T3 UPTAKE: T3 Uptake: 33 % (ref 22–35)

## 2022-08-01 LAB — T4
Free Thyroxine Index: 4.1 — ABNORMAL HIGH (ref 1.4–3.8)
T4, Total: 12.5 ug/dL — ABNORMAL HIGH (ref 5.1–11.9)

## 2022-08-01 LAB — TSH: TSH: 0.02 mIU/L — ABNORMAL LOW

## 2022-08-30 ENCOUNTER — Ambulatory Visit: Payer: Self-pay

## 2022-08-30 ENCOUNTER — Ambulatory Visit (INDEPENDENT_AMBULATORY_CARE_PROVIDER_SITE_OTHER): Payer: BC Managed Care – PPO

## 2022-08-30 ENCOUNTER — Encounter: Payer: Self-pay | Admitting: Orthopaedic Surgery

## 2022-08-30 ENCOUNTER — Ambulatory Visit (INDEPENDENT_AMBULATORY_CARE_PROVIDER_SITE_OTHER): Payer: BC Managed Care – PPO | Admitting: Orthopaedic Surgery

## 2022-08-30 VITALS — Ht 67.0 in | Wt 192.0 lb

## 2022-08-30 DIAGNOSIS — M25562 Pain in left knee: Secondary | ICD-10-CM

## 2022-08-30 DIAGNOSIS — G8929 Other chronic pain: Secondary | ICD-10-CM

## 2022-08-30 DIAGNOSIS — M25561 Pain in right knee: Secondary | ICD-10-CM

## 2022-08-30 MED ORDER — LIDOCAINE HCL 1 % IJ SOLN
4.0000 mL | INTRAMUSCULAR | Status: AC | PRN
  Administered 2022-08-30: 4 mL

## 2022-08-30 MED ORDER — METHYLPREDNISOLONE ACETATE 40 MG/ML IJ SUSP
40.0000 mg | INTRAMUSCULAR | Status: AC | PRN
  Administered 2022-08-30: 40 mg via INTRA_ARTICULAR
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
## 2022-09-27 ENCOUNTER — Other Ambulatory Visit: Payer: Self-pay

## 2022-09-27 ENCOUNTER — Emergency Department (HOSPITAL_BASED_OUTPATIENT_CLINIC_OR_DEPARTMENT_OTHER): Payer: BC Managed Care – PPO

## 2022-09-27 ENCOUNTER — Encounter (HOSPITAL_BASED_OUTPATIENT_CLINIC_OR_DEPARTMENT_OTHER): Payer: Self-pay | Admitting: Emergency Medicine

## 2022-09-27 ENCOUNTER — Emergency Department (HOSPITAL_BASED_OUTPATIENT_CLINIC_OR_DEPARTMENT_OTHER)
Admission: EM | Admit: 2022-09-27 | Discharge: 2022-09-27 | Disposition: A | Discharge status: Home / Self Care | Payer: BC Managed Care – PPO | Attending: Emergency Medicine | Admitting: Emergency Medicine

## 2022-09-27 DIAGNOSIS — Z79890 Hormone replacement therapy (postmenopausal): Secondary | ICD-10-CM | POA: Diagnosis not present

## 2022-09-27 DIAGNOSIS — J3489 Other specified disorders of nose and nasal sinuses: Secondary | ICD-10-CM | POA: Diagnosis not present

## 2022-09-27 DIAGNOSIS — R000 Tachycardia, unspecified: Secondary | ICD-10-CM | POA: Diagnosis not present

## 2022-09-27 DIAGNOSIS — R509 Fever, unspecified: Secondary | ICD-10-CM | POA: Diagnosis present

## 2022-09-27 DIAGNOSIS — E039 Hypothyroidism, unspecified: Secondary | ICD-10-CM | POA: Insufficient documentation

## 2022-09-27 DIAGNOSIS — Z1152 Encounter for screening for COVID-19: Secondary | ICD-10-CM | POA: Insufficient documentation

## 2022-09-27 LAB — CBC WITH DIFFERENTIAL/PLATELET
Abs Immature Granulocytes: 0.03 10*3/uL (ref 0.00–0.07)
Basophils Absolute: 0 10*3/uL (ref 0.0–0.1)
Basophils Relative: 0 %
Eosinophils Absolute: 0.1 10*3/uL (ref 0.0–0.5)
Eosinophils Relative: 1 %
HCT: 39.9 % (ref 36.0–46.0)
Hemoglobin: 13.5 g/dL (ref 12.0–15.0)
Immature Granulocytes: 0 %
Lymphocytes Relative: 15 %
Lymphs Abs: 1.4 10*3/uL (ref 0.7–4.0)
MCH: 27.7 pg (ref 26.0–34.0)
MCHC: 33.8 g/dL (ref 30.0–36.0)
MCV: 81.8 fL (ref 80.0–100.0)
Monocytes Absolute: 0.5 10*3/uL (ref 0.1–1.0)
Monocytes Relative: 6 %
Neutro Abs: 6.9 10*3/uL (ref 1.7–7.7)
Neutrophils Relative %: 78 %
Platelets: 266 10*3/uL (ref 150–400)
RBC: 4.88 MIL/uL (ref 3.87–5.11)
RDW: 12.3 % (ref 11.5–15.5)
WBC: 8.9 10*3/uL (ref 4.0–10.5)
nRBC: 0 % (ref 0.0–0.2)

## 2022-09-27 LAB — BASIC METABOLIC PANEL
Anion gap: 9 (ref 5–15)
BUN: 14 mg/dL (ref 6–20)
CO2: 24 mmol/L (ref 22–32)
Calcium: 8.9 mg/dL (ref 8.9–10.3)
Chloride: 106 mmol/L (ref 98–111)
Creatinine, Ser: 0.79 mg/dL (ref 0.44–1.00)
GFR, Estimated: >60 mL/min (ref >60)
Glucose, Bld: 114 mg/dL — ABNORMAL HIGH (ref 70–99)
Potassium: 4.1 mmol/L (ref 3.5–5.1)
Sodium: 139 mmol/L (ref 135–145)

## 2022-09-27 LAB — RESP PANEL BY RT-PCR (FLU A&B, COVID) ARPGX2
Influenza A by PCR: NEGATIVE
Influenza B by PCR: NEGATIVE
SARS Coronavirus 2 by RT PCR: NEGATIVE

## 2022-09-27 LAB — GROUP A STREP BY PCR: Group A Strep by PCR: NOT DETECTED

## 2022-09-27 MED ORDER — SODIUM CHLORIDE 0.9 % IV BOLUS
1000.0000 mL | Freq: Once | INTRAVENOUS | Status: AC
  Administered 2022-09-27: 1000 mL via INTRAVENOUS

## 2022-09-27 MED ORDER — ACETAMINOPHEN 500 MG PO TABS
1000.0000 mg | ORAL_TABLET | Freq: Once | ORAL | Status: AC
  Administered 2022-09-27: 1000 mg via ORAL
  Filled 2022-09-27: qty 2

## 2022-09-27 NOTE — Discharge Instructions (Signed)
Tylenol or Motrin as needed for fever or pain.  I would retest for COVID in 2 days  Return for new or worsening symptoms

## 2022-09-27 NOTE — ED Triage Notes (Signed)
Cough, nasal drainage, sneezing, chills, body aches, sore throat since yesterday. Unknown exposure to sick person.

## 2022-09-27 NOTE — ED Provider Notes (Addendum)
[facility] Provider Note   CSN: [id number] Arrival date & time: 09/27/22  2030    History  Chief Complaint  Patient presents with   flu-like symptoms    Tammy Nichols is a 51 y.o. female with past medical history significant for seizure disorder, hypothyroidism here for evaluation of feeling unwell.  Patient with 12 hours of congestion, rhinorrhea, cough, myalgias.  Denies she had a fever until arrival.  Took cold and sinus medicine earlier today.  No known sick contacts or does work at a school.  Tolerating p.o. intake.  No chest pain, shortness of breath, back pain, abdominal pain, uti sx, rash, lesions.  HPI     Home Medications Prior to Admission medications   Medication Sig Start Date End Date Taking? Authorizing Provider  ASHWAGANDHA PO Take by mouth.    [provider]  lamoTRIgine (LAMICTAL) 100 MG tablet Take 100 mg by mouth daily.    [provider]  levothyroxine (SYNTHROID, LEVOTHROID) 112 MCG tablet Take 224 mcg by mouth daily. 07/04/16   [provider]      Allergies    Other, Soap, and Amoxicillin    Review of Systems   Review of Systems  Constitutional:  Positive for activity change, appetite change, fatigue and fever.  HENT:  Positive for congestion, postnasal drip, rhinorrhea and sore throat. Negative for dental problem, drooling, ear discharge, ear pain, facial swelling, hearing loss, mouth sores, nosebleeds, sinus pressure, sinus pain, sneezing, tinnitus, trouble swallowing and voice change.   Respiratory:  Positive for cough.   Cardiovascular: Negative.   Gastrointestinal: Negative.   Genitourinary: Negative.   Musculoskeletal: Negative.   Skin: Negative.   Neurological: Negative.   All other systems reviewed and are negative.   Physical Exam Updated Vital Signs BP (!) 153/81 (BP Location: Left Arm)   Pulse 98   Temp (!) 101 F (38.3 C)   Resp 16   Ht '5\' 7"'$  (1.702 m)   Wt 86.2 kg    LMP  (LMP Unknown) Comment: "more than 6 years ago"  SpO2 98%   BMI 29.76 kg/m  Physical Exam Vitals and nursing note reviewed.  Constitutional:      General: She is not in acute distress.    Appearance: She is well-developed. She is not ill-appearing, toxic-appearing or diaphoretic.  HENT:     Head: Normocephalic and atraumatic.     Nose: Congestion and rhinorrhea present.     Comments: Clear rhinorrhea bilaterally    Mouth/Throat:     Mouth: Mucous membranes are moist.     Comments: Posterior oropharynx with mild erythema.  Uvula midline.  Tonsils 1+ bilaterally with erythema without exudate.  No evidence of PTA or RPA.  No pooling of secretions.  No sublingual induration Eyes:     Pupils: Pupils are equal, round, and reactive to light.  Neck:     Comments: Full ROM. No meningismus  Cardiovascular:     Rate and Rhythm: Tachycardia present.     Pulses: Normal pulses.     Heart sounds: Normal heart sounds.  Pulmonary:     Effort: Pulmonary effort is normal. No respiratory distress.     Breath sounds: Normal breath sounds.     Comments: Clear bil, speaks in full sentences without difficulty Abdominal:     General: Bowel sounds are normal. There is no distension.     Palpations: Abdomen is soft.     Comments: Soft non tender  Musculoskeletal:  General: No swelling, tenderness, deformity or signs of injury. Normal range of motion.     Cervical back: Normal range of motion and neck supple. No tenderness.     Right lower leg: No edema.     Left lower leg: No edema.  Skin:    General: Skin is warm and dry.  Neurological:     General: No focal deficit present.     Mental Status: She is alert.  Psychiatric:        Mood and Affect: Mood normal.    ED Results / Procedures / Treatments   Labs (all labs ordered are listed, but only abnormal results are displayed) Labs Reviewed  BASIC METABOLIC PANEL - Abnormal; Notable for the following components:      Result Value    Glucose, Bld 114 (*)    All other components within normal limits  RESP PANEL BY RT-PCR (FLU A&B, COVID) [id number]  GROUP A STREP BY PCR  CBC WITH DIFFERENTIAL/PLATELET    EKG None  Radiology DG Chest 2 View  Result Date: 09/27/2022 CLINICAL DATA:  Cough and fever. EXAM: CHEST - 2 VIEW COMPARISON:  12/13/2021 chest CT FINDINGS: The cardiomediastinal contours are normal. The lungs are clear. Pulmonary vasculature is normal. No consolidation, pleural effusion, or pneumothorax. No acute osseous abnormalities are seen. IMPRESSION: Negative radiographs of the chest. Electronically Signed   By: Keith Rake M.D.   On: 09/27/2022 21:56    Procedures Procedures    Medications Ordered in ED Medications  acetaminophen (TYLENOL) tablet 1,000 mg (1,000 mg Oral Given 09/27/22 2040)  sodium chloride 0.9 % bolus 1,000 mL (1,000 mLs Intravenous New Bag/Given 09/27/22 2200)   ED Course/ Medical Decision Making/ A&P    51 year old here for evaluation of feeling unwell.  12 hours of congestion, rhinorrhea, cough, sore throat, myalgias.  Arrival here febrile, tachycardic however did not appear septic.  Fevers clinically well-hydrated.  Her heart and lungs are clear.  Abdomen is soft, nontender.  Posterior oropharynx mild erythema without exudate.  No evidence of PTA or RPA.  No tonsillar exudate, low suspicion for strep pharyngitis.  She has no neck stiffness or neck rigidity.  Low suspicion for meningitis.  Will plan on labs, imaging, reassess  Labs and imaging personally viewed and interpreted:  CBC without leukocytosis Metabolic panel without significant abnormality Strep test negative COVID, flu negative Chest x-ray without pneumonia  Reassessed after IV fluids.  Defervesced.  Tachycardia improved.  At this time I have low suspicion for sepsis.  Given she is only had 12 hours of symptoms I recommended repeat viral testing in 2 days.  Tylenol, Motrin as needed for fever.  She will return for  new or worsening symptoms.  The patient has been appropriately medically screened and/or stabilized in the ED. I have low suspicion for any other emergent medical condition which would require further screening, evaluation or treatment in the ED or require inpatient management.  Patient is hemodynamically stable and in no acute distress.  Patient able to ambulate in department prior to ED.  Evaluation does not show acute pathology that would require ongoing or additional emergent interventions while in the emergency department or further inpatient treatment.  I have discussed the diagnosis with the patient and answered all questions.  Pain is been managed while in the emergency department and patient has no further complaints prior to discharge.  Patient is comfortable with plan discussed in room and is stable for discharge at this time.  I have  discussed strict return precautions for returning to the emergency department.  Patient was encouraged to follow-up with PCP/specialist refer to at discharge.                           Medical Decision Making Amount and/or Complexity of Data Reviewed External Data Reviewed: labs, radiology and notes. Labs: ordered. Decision-making details documented in ED Course. Radiology: ordered and independent interpretation performed. Decision-making details documented in ED Course.  Risk OTC drugs. Prescription drug management. Parenteral controlled substances. Decision regarding hospitalization. Diagnosis or treatment significantly limited by social determinants of health.    Final Clinical Impression(s) / ED Diagnoses Final diagnoses:  Fever, unspecified fever cause    Rx / DC Orders ED Discharge Orders     None           Mattson Dayal A, PA-C 09/27/22 2306    Fredia Sorrow, MD 10/05/22 1524

## 2022-10-17 ENCOUNTER — Other Ambulatory Visit: Payer: Self-pay | Admitting: Internal Medicine

## 2022-10-17 DIAGNOSIS — C73 Malignant neoplasm of thyroid gland: Secondary | ICD-10-CM

## 2022-11-28 ENCOUNTER — Other Ambulatory Visit: Payer: Self-pay | Admitting: Internal Medicine

## 2022-11-28 DIAGNOSIS — Z1231 Encounter for screening mammogram for malignant neoplasm of breast: Secondary | ICD-10-CM

## 2022-12-14 ENCOUNTER — Ambulatory Visit
Admission: RE | Admit: 2022-12-14 | Discharge: 2022-12-14 | Disposition: A | Discharge status: Home / Self Care | Payer: BC Managed Care – PPO | Source: Ambulatory Visit | Attending: Internal Medicine | Admitting: Internal Medicine

## 2022-12-14 DIAGNOSIS — C73 Malignant neoplasm of thyroid gland: Secondary | ICD-10-CM

## 2023-01-16 ENCOUNTER — Ambulatory Visit: Payer: BC Managed Care – PPO

## 2023-03-01 ENCOUNTER — Ambulatory Visit
Admission: RE | Admit: 2023-03-01 | Discharge: 2023-03-01 | Disposition: A | Discharge status: Home / Self Care | Payer: BC Managed Care – PPO | Source: Ambulatory Visit | Attending: Internal Medicine | Admitting: Internal Medicine

## 2023-03-01 ENCOUNTER — Ambulatory Visit: Payer: BC Managed Care – PPO

## 2023-03-01 DIAGNOSIS — Z1231 Encounter for screening mammogram for malignant neoplasm of breast: Secondary | ICD-10-CM

## 2023-04-20 ENCOUNTER — Other Ambulatory Visit: Payer: Self-pay | Admitting: Internal Medicine

## 2023-04-20 ENCOUNTER — Encounter: Payer: Self-pay | Admitting: Internal Medicine

## 2023-04-20 DIAGNOSIS — C73 Malignant neoplasm of thyroid gland: Secondary | ICD-10-CM

## 2023-04-25 ENCOUNTER — Ambulatory Visit
Admission: RE | Admit: 2023-04-25 | Discharge: 2023-04-25 | Disposition: A | Discharge status: Home / Self Care | Payer: BC Managed Care – PPO | Source: Ambulatory Visit | Attending: Internal Medicine | Admitting: Internal Medicine

## 2023-04-25 DIAGNOSIS — C73 Malignant neoplasm of thyroid gland: Secondary | ICD-10-CM

## 2023-04-25 MED ORDER — IOPAMIDOL (ISOVUE-300) INJECTION 61%
75.0000 mL | Freq: Once | INTRAVENOUS | Status: AC | PRN
  Administered 2023-04-25: 75 mL via INTRAVENOUS

## 2023-06-08 ENCOUNTER — Other Ambulatory Visit: Payer: Self-pay | Admitting: Internal Medicine

## 2023-10-23 ENCOUNTER — Other Ambulatory Visit: Payer: Self-pay | Admitting: Internal Medicine

## 2023-10-23 DIAGNOSIS — R911 Solitary pulmonary nodule: Secondary | ICD-10-CM

## 2023-10-23 DIAGNOSIS — C73 Malignant neoplasm of thyroid gland: Secondary | ICD-10-CM

## 2023-10-26 ENCOUNTER — Ambulatory Visit
Admission: RE | Admit: 2023-10-26 | Discharge: 2023-10-26 | Disposition: A | Discharge status: Home / Self Care | Payer: BC Managed Care – PPO | Source: Ambulatory Visit | Attending: Internal Medicine | Admitting: Internal Medicine

## 2023-10-26 DIAGNOSIS — R911 Solitary pulmonary nodule: Secondary | ICD-10-CM

## 2023-10-26 DIAGNOSIS — C73 Malignant neoplasm of thyroid gland: Secondary | ICD-10-CM

## 2023-10-26 MED ORDER — IOPAMIDOL (ISOVUE-300) INJECTION 61%
100.0000 mL | Freq: Once | INTRAVENOUS | Status: AC | PRN
  Administered 2023-10-26: 100 mL via INTRAVENOUS

## 2023-11-19 ENCOUNTER — Other Ambulatory Visit (HOSPITAL_COMMUNITY): Payer: Self-pay | Admitting: Internal Medicine

## 2023-11-19 DIAGNOSIS — C73 Malignant neoplasm of thyroid gland: Secondary | ICD-10-CM

## 2023-11-19 DIAGNOSIS — R911 Solitary pulmonary nodule: Secondary | ICD-10-CM

## 2023-12-24 ENCOUNTER — Encounter (HOSPITAL_COMMUNITY)
Admission: RE | Admit: 2023-12-24 | Discharge: 2023-12-24 | Disposition: A | Discharge status: Home / Self Care | Payer: 59 | Source: Ambulatory Visit | Attending: Internal Medicine | Admitting: Internal Medicine

## 2023-12-24 DIAGNOSIS — R911 Solitary pulmonary nodule: Secondary | ICD-10-CM | POA: Diagnosis present

## 2023-12-24 DIAGNOSIS — C73 Malignant neoplasm of thyroid gland: Secondary | ICD-10-CM | POA: Diagnosis present

## 2023-12-24 MED ORDER — THYROTROPIN ALFA 0.9 MG IM SOLR
INTRAMUSCULAR | Status: AC
  Filled 2023-12-24: qty 0.9

## 2023-12-24 MED ORDER — THYROTROPIN ALFA 0.9 MG IM SOLR
0.9000 mg | INTRAMUSCULAR | Status: AC
  Administered 2023-12-24: 0.9 mg via INTRAMUSCULAR

## 2023-12-25 ENCOUNTER — Other Ambulatory Visit (HOSPITAL_COMMUNITY): Payer: Self-pay

## 2023-12-25 ENCOUNTER — Encounter (HOSPITAL_COMMUNITY)
Admission: RE | Admit: 2023-12-25 | Discharge: 2023-12-25 | Disposition: A | Discharge status: Home / Self Care | Payer: 59 | Source: Ambulatory Visit | Attending: Internal Medicine | Admitting: Internal Medicine

## 2023-12-25 DIAGNOSIS — C73 Malignant neoplasm of thyroid gland: Secondary | ICD-10-CM | POA: Diagnosis not present

## 2023-12-25 MED ORDER — THYROTROPIN ALFA 0.9 MG IM SOLR
INTRAMUSCULAR | Status: AC
  Filled 2023-12-25: qty 0.9

## 2023-12-25 MED ORDER — THYROTROPIN ALFA 0.9 MG IM SOLR
0.9000 mg | INTRAMUSCULAR | Status: AC
  Administered 2023-12-25: 0.9 mg via INTRAMUSCULAR

## 2023-12-26 ENCOUNTER — Encounter (HOSPITAL_COMMUNITY)
Admission: RE | Admit: 2023-12-26 | Discharge: 2023-12-26 | Disposition: A | Discharge status: Home / Self Care | Payer: 59 | Source: Ambulatory Visit | Attending: Internal Medicine | Admitting: Internal Medicine

## 2023-12-26 ENCOUNTER — Other Ambulatory Visit (HOSPITAL_COMMUNITY): Payer: Self-pay

## 2023-12-26 MED ORDER — SODIUM IODIDE I 131 CAPSULE
136.7000 | Freq: Once | INTRAVENOUS | Status: AC
  Administered 2023-12-26: 136.7 via ORAL

## 2024-01-04 ENCOUNTER — Encounter (HOSPITAL_COMMUNITY)
Admission: RE | Admit: 2024-01-04 | Discharge: 2024-01-04 | Disposition: A | Discharge status: Home / Self Care | Payer: 59 | Source: Ambulatory Visit | Attending: Internal Medicine | Admitting: Internal Medicine

## 2024-01-04 ENCOUNTER — Other Ambulatory Visit (HOSPITAL_COMMUNITY): Payer: Self-pay

## 2024-01-04 DIAGNOSIS — C73 Malignant neoplasm of thyroid gland: Secondary | ICD-10-CM | POA: Insufficient documentation

## 2024-01-04 DIAGNOSIS — R911 Solitary pulmonary nodule: Secondary | ICD-10-CM | POA: Diagnosis present

## 2024-01-08 ENCOUNTER — Other Ambulatory Visit: Payer: Self-pay | Admitting: Internal Medicine

## 2024-01-08 DIAGNOSIS — C7951 Secondary malignant neoplasm of bone: Secondary | ICD-10-CM

## 2024-01-08 DIAGNOSIS — C73 Malignant neoplasm of thyroid gland: Secondary | ICD-10-CM

## 2024-01-10 ENCOUNTER — Ambulatory Visit
Admission: RE | Admit: 2024-01-10 | Discharge: 2024-01-10 | Disposition: A | Discharge status: Home / Self Care | Source: Ambulatory Visit | Attending: Internal Medicine | Admitting: Internal Medicine

## 2024-01-10 DIAGNOSIS — C73 Malignant neoplasm of thyroid gland: Secondary | ICD-10-CM

## 2024-01-10 DIAGNOSIS — C7951 Secondary malignant neoplasm of bone: Secondary | ICD-10-CM

## 2024-01-10 MED ORDER — IOPAMIDOL (ISOVUE-300) INJECTION 61%
100.0000 mL | Freq: Once | INTRAVENOUS | Status: AC | PRN
  Administered 2024-01-10: 100 mL via INTRAVENOUS

## 2024-01-25 ENCOUNTER — Encounter: Payer: Self-pay | Admitting: Oncology

## 2024-01-25 ENCOUNTER — Inpatient Hospital Stay: Attending: Oncology | Admitting: Oncology

## 2024-01-25 ENCOUNTER — Other Ambulatory Visit: Payer: Self-pay

## 2024-01-25 ENCOUNTER — Inpatient Hospital Stay

## 2024-01-25 VITALS — BP 138/96 | HR 91 | Temp 97.7°F | Ht 67.0 in | Wt 166.4 lb

## 2024-01-25 DIAGNOSIS — R918 Other nonspecific abnormal finding of lung field: Secondary | ICD-10-CM | POA: Insufficient documentation

## 2024-01-25 DIAGNOSIS — G40909 Epilepsy, unspecified, not intractable, without status epilepticus: Secondary | ICD-10-CM | POA: Diagnosis not present

## 2024-01-25 DIAGNOSIS — E039 Hypothyroidism, unspecified: Secondary | ICD-10-CM | POA: Insufficient documentation

## 2024-01-25 DIAGNOSIS — C7951 Secondary malignant neoplasm of bone: Secondary | ICD-10-CM | POA: Diagnosis present

## 2024-01-25 DIAGNOSIS — Z85850 Personal history of malignant neoplasm of thyroid: Secondary | ICD-10-CM | POA: Insufficient documentation

## 2024-01-25 DIAGNOSIS — Z79899 Other long term (current) drug therapy: Secondary | ICD-10-CM | POA: Insufficient documentation

## 2024-01-25 DIAGNOSIS — C73 Malignant neoplasm of thyroid gland: Secondary | ICD-10-CM | POA: Insufficient documentation

## 2024-01-25 LAB — CBC WITH DIFFERENTIAL (CANCER CENTER ONLY)
Abs Immature Granulocytes: 0.01 10*3/uL (ref 0.00–0.07)
Basophils Absolute: 0 10*3/uL (ref 0.0–0.1)
Basophils Relative: 0 %
Eosinophils Absolute: 0.1 10*3/uL (ref 0.0–0.5)
Eosinophils Relative: 2 %
HCT: 38.5 % (ref 36.0–46.0)
Hemoglobin: 12.9 g/dL (ref 12.0–15.0)
Immature Granulocytes: 0 %
Lymphocytes Relative: 31 %
Lymphs Abs: 1.4 10*3/uL (ref 0.7–4.0)
MCH: 28.2 pg (ref 26.0–34.0)
MCHC: 33.5 g/dL (ref 30.0–36.0)
MCV: 84.2 fL (ref 80.0–100.0)
Monocytes Absolute: 0.5 10*3/uL (ref 0.1–1.0)
Monocytes Relative: 11 %
Neutro Abs: 2.5 10*3/uL (ref 1.7–7.7)
Neutrophils Relative %: 56 %
Platelet Count: 151 10*3/uL (ref 150–400)
RBC: 4.57 MIL/uL (ref 3.87–5.11)
RDW: 13 % (ref 11.5–15.5)
WBC Count: 4.6 10*3/uL (ref 4.0–10.5)
nRBC: 0 % (ref 0.0–0.2)

## 2024-01-25 LAB — CMP (CANCER CENTER ONLY)
ALT: 56 U/L — ABNORMAL HIGH (ref 0–44)
AST: 40 U/L (ref 15–41)
Albumin: 4.5 g/dL (ref 3.5–5.0)
Alkaline Phosphatase: 118 U/L (ref 38–126)
Anion gap: 7 (ref 5–15)
BUN: 18 mg/dL (ref 6–20)
CO2: 30 mmol/L (ref 22–32)
Calcium: 9.3 mg/dL (ref 8.9–10.3)
Chloride: 104 mmol/L (ref 98–111)
Creatinine: 0.83 mg/dL (ref 0.44–1.00)
GFR, Estimated: >60 mL/min (ref >60)
Glucose, Bld: 88 mg/dL (ref 70–99)
Potassium: 4.2 mmol/L (ref 3.5–5.1)
Sodium: 141 mmol/L (ref 135–145)
Total Bilirubin: 0.3 mg/dL (ref 0.0–1.2)
Total Protein: 7.6 g/dL (ref 6.5–8.1)

## 2024-01-25 LAB — PHOSPHORUS: Phosphorus: 4.5 mg/dL (ref 2.5–4.6)

## 2024-01-25 LAB — MAGNESIUM: Magnesium: 1.9 mg/dL (ref 1.7–2.4)

## 2024-01-25 NOTE — Assessment & Plan Note (Signed)
 Papillary thyroid cancer, initially diagnosed in 2011, was treated with total thyroidectomy and radioactive iodine therapy.   She has been under regular monitoring with scans every six months. Recent scans have shown a slow-growing lung nodule in June 2024, which led to follow-up scans in January 2025. Lung nodules have minimally increased from 4mm to 5mm.   She later had a whole-body I-131 scan on 01/04/2024 which showed activity in the bones, particularly in the left pelvic area and left chest.  Lung nodules were not prominent on this scan.   Disease burden is low, but bone involvement is concerning.   Xgeva (denosumab) is proposed to control bone disease and prevent fractures.   Risks include hypocalcemia and osteonecrosis of the jaw.   Calcium and vitamin D supplementation are necessary to mitigate hypocalcemia.   Dental clearance is required to prevent jaw complications.   Treatment duration is indefinite, but a minimum of one year is planned, with regular scans to monitor disease progression.  - Administer Xgeva (denosumab) injection every four weeks to control bone disease and prevent fractures.  This will be started once we get authorization for use.  - Ensure daily calcium and vitamin D supplementation to prevent hypocalcemia.  - Monitor calcium, phosphorus, and magnesium levels with each Xgeva injection.  - Obtain dental clearance from her dentist, Dr. Renato Gails, to prevent osteonecrosis of the jaw.  Plan is to schedule the first Xgeva injection for next week, pending insurance and dental clearance.  - Continue regular scans at endocrinology office to monitor disease status.  I will see her when she is due for dose 2 of Xgeva.

## 2024-01-25 NOTE — Progress Notes (Signed)
 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 lady with a past medical history of papillary thyroid cancer diagnosed in 2011 status post thyroidectomy followed by radioactive iodine therapy, remained on close surveillance under endocrinology department, was recently found to have bone metastatic disease on bone scan.  She was referred to our clinic for consideration of Xgeva injections.  Her other comorbidities include seizure disorder.   Primary cancer of thyroid gland metastatic to bone [facility]) Papillary thyroid cancer, initially diagnosed in 2011, was treated with total thyroidectomy and radioactive iodine therapy.   She has been under regular monitoring with scans every six months. Recent scans have shown a slow-growing lung nodule in June 2024, which led to follow-up scans in January 2025. Lung nodules have minimally increased from 4mm to 5mm.   She later had a whole-body I-131 scan on 01/04/2024 which showed activity in the bones, particularly in the left pelvic area and left chest.  Lung nodules were not prominent on this scan.   Disease burden is low, but bone involvement is concerning.   Xgeva (denosumab) is proposed to control bone disease and prevent fractures.   Risks include hypocalcemia and osteonecrosis of the jaw.   Calcium and vitamin D supplementation are necessary to mitigate hypocalcemia.   Dental clearance is required to prevent jaw complications.   Treatment duration is indefinite, but a minimum  of one year is planned, with regular scans to monitor disease progression.  - Administer Xgeva (denosumab) injection every four weeks to control bone disease and prevent fractures.  This will be started once we get authorization for use.  - Ensure daily calcium and vitamin D supplementation to prevent hypocalcemia.  - Monitor calcium, phosphorus, and magnesium levels with each Xgeva injection.  - Obtain dental clearance from her dentist, Dr. Renato Gails, to prevent osteonecrosis of the jaw.  Plan is to schedule the first Xgeva injection for next week, pending insurance and dental clearance.  - Continue regular scans at endocrinology office to monitor disease status.  I will see her when she is due for dose 2 of Xgeva.  Seizure disorder (HCC) Seizures have been present since 1991 and are currently well-controlled with medication. No recent seizure activity reported.    I reviewed lab results and outside records for this visit and discussed relevant results with the patient. Diagnosis, plan of care and treatment options were also discussed in detail with the patient. Opportunity provided to ask questions and answers provided to her apparent satisfaction. Provided instructions to call our clinic with any problems, questions or concerns prior to return visit. I recommended to continue follow-up with PCP and sub-specialists. She verbalized understanding and agreed with the plan. No barriers to learning was detected.  NCCN guidelines have been consulted in the planning of this patient's care.  Meryl Crutch, MD  01/25/2024 5:13 PM  [facility] - [facility] DE[facility] [address] [place] [address] Dept: [phone] Dept Fax: [phone]  HISTORY OF PRESENTING ILLNESS:   Discussed the use of AI scribe software for clinical note transcription with the patient, who gave verbal consent to proceed.   I have reviewed her  chart and materials related to her cancer extensively and collaborated history with the patient. Summary of oncologic history is as follows:  Multifocal papillary thyroid carcinoma, follicular variant, T2,N1,M0 at the time of diagnosis in 2011.  She underwent thyroidectomy followed by radioactive iodine therapy.   She has been under regular monitoring with scans every six months. Recent scans have shown a slow-growing lung nodule in June 2024, which led to follow-up scans in January 2025.  She later had a whole-body I-131 scan on 01/04/2024 which showed activity in the bones, particularly in the left pelvic area and left chest.  Lung nodules were not prominent on this scan.  She was referred to [place] for consideration of Xgeva injections for bone metastatic disease from papillary thyroid cancer.   INTERVAL HISTORY:  The patient describes feeling generally well, with no new bone pains or fever. However, she reports feeling winded when going up stairs. The patient also has a history of seizures and has been on Lamictal.    MEDICAL HISTORY:  Past Medical History:  Diagnosis Date   Acquired hypothyroidism    Iron deficiency    Seizure disorder (HCC)    Thyroid cancer (HCC)     SURGICAL HISTORY: Past Surgical History:  Procedure Laterality Date   BREAST EXCISIONAL BIOPSY Right    BREAST LUMPECTOMY WITH RADIOACTIVE SEED LOCALIZATION Right 03/25/2020   Procedure: RIGHT BREAST LUMPECTOMY WITH RADIOACTIVE SEED LOCALIZATION;  Surgeon: Abigail Miyamoto, MD;  Location: [facility];  Service: General;  Laterality: Right;  LMA   THYROIDECTOMY      SOCIAL HISTORY: Social History   Socioeconomic History   Marital status: Widowed    Spouse name: Not on file   Number of children: Not on file   Years of education: Not on file   Highest education level: Not on file  Occupational History   Not on file  Tobacco Use   Smoking status: Never   Smokeless tobacco: Never  Vaping Use    Vaping status: Never Used  Substance and Sexual Activity   Alcohol use: Yes    Comment: social   Drug use: No   Sexual activity: Not on file  Other Topics Concern   Not on file  Social History Narrative   Not on file   Social Drivers of Health   Financial Resource Strain: Not on file  Food Insecurity: Not on file  Transportation Needs: Not on file  Physical Activity: Not on file  Stress: Not on file  Social Connections: Unknown (05/21/2023)   Received from [facility]   Social Network    Social Network: Not on file  Intimate Partner Violence: Unknown (05/21/2023)   Received from [facility]   HITS    Physically Hurt: Not on file    Insult or Talk Down To: Not on file    Threaten Physical Harm: Not on file    Scream or Curse: Not on file    FAMILY HISTORY: Family History  Problem Relation Age of Onset   Hypertension Mother    Hypertension Father     ALLERGIES:  She is allergic to other, soap, and amoxicillin.  MEDICATIONS:  Current Outpatient Medications  Medication Sig Dispense Refill   ASHWAGANDHA PO Take by mouth.     lamoTRIgine (LAMICTAL) 100 MG tablet Take  100 mg by mouth daily.     levothyroxine (SYNTHROID, LEVOTHROID) 112 MCG tablet Take 224 mcg by mouth daily.     No current facility-administered medications for this visit.    REVIEW OF SYSTEMS:    Review of Systems - Oncology  All other pertinent systems were reviewed with the patient and are negative.  PHYSICAL EXAMINATION:   Onc Performance Status - 01/25/24 1300       ECOG Perf Status   ECOG Perf Status Restricted in physically strenuous activity but ambulatory and able to carry out work of a light or sedentary nature, e.g., light house work, office work      KPS SCALE   KPS % SCORE Able to carry on normal activity, minor s/s of disease             Vitals:   01/25/24 1300  BP: (!) 138/96  Pulse: 91  Temp: 97.7 F (36.5 C)  SpO2: 98%   Filed Weights   01/25/24 1300   Weight: 166 lb 6.4 oz (75.5 kg)    Physical Exam Constitutional:      General: She is not in acute distress.    Appearance: Normal appearance.  HENT:     Head: Normocephalic and atraumatic.  Eyes:     General: No scleral icterus.    Conjunctiva/sclera: Conjunctivae normal.  Cardiovascular:     Rate and Rhythm: Normal rate and regular rhythm.     Heart sounds: Normal heart sounds.  Pulmonary:     Effort: Pulmonary effort is normal.     Breath sounds: Normal breath sounds.  Abdominal:     General: There is no distension.  Musculoskeletal:     Right lower leg: No edema.     Left lower leg: No edema.  Lymphadenopathy:     Cervical: No cervical adenopathy.  Neurological:     General: No focal deficit present.     Mental Status: She is alert and oriented to person, place, and time.  Psychiatric:        Mood and Affect: Mood normal.        Behavior: Behavior normal.        Thought Content: Thought content normal.      LABORATORY DATA:   I have reviewed the data as listed.  Results for orders placed or performed in visit on 01/25/24  Phosphorus  Result Value Ref Range   Phosphorus 4.5 2.5 - 4.6 mg/dL  Magnesium  Result Value Ref Range   Magnesium 1.9 1.7 - 2.4 mg/dL  CMP (Cancer Center only)  Result Value Ref Range   Sodium 141 135 - 145 mmol/L   Potassium 4.2 3.5 - 5.1 mmol/L   Chloride 104 98 - 111 mmol/L   CO2 30 22 - 32 mmol/L   Glucose, Bld 88 70 - 99 mg/dL   BUN 18 6 - 20 mg/dL   Creatinine 1.30 8.65 - 1.00 mg/dL   Calcium 9.3 8.9 - 78.4 mg/dL   Total Protein 7.6 6.5 - 8.1 g/dL   Albumin 4.5 3.5 - 5.0 g/dL   AST 40 15 - 41 U/L   ALT 56 (H) 0 - 44 U/L   Alkaline Phosphatase 118 38 - 126 U/L   Total Bilirubin 0.3 0.0 - 1.2 mg/dL   GFR, Estimated >69 >62 mL/min   Anion gap 7 5 - 15  CBC with Differential (Cancer Center Only)  Result Value Ref Range   WBC Count 4.6 4.0 - 10.5 K/uL  RBC 4.57 3.87 - 5.11 MIL/uL   Hemoglobin 12.9 12.0 - 15.0 g/dL   HCT  40.9 81.1 - 91.4 %   MCV 84.2 80.0 - 100.0 fL   MCH 28.2 26.0 - 34.0 pg   MCHC 33.5 30.0 - 36.0 g/dL   RDW 78.2 95.6 - 21.3 %   Platelet Count 151 150 - 400 K/uL   nRBC 0.0 0.0 - 0.2 %   Neutrophils Relative % 56 %   Neutro Abs 2.5 1.7 - 7.7 K/uL   Lymphocytes Relative 31 %   Lymphs Abs 1.4 0.7 - 4.0 K/uL   Monocytes Relative 11 %   Monocytes Absolute 0.5 0.1 - 1.0 K/uL   Eosinophils Relative 2 %   Eosinophils Absolute 0.1 0.0 - 0.5 K/uL   Basophils Relative 0 %   Basophils Absolute 0.0 0.0 - 0.1 K/uL   Immature Granulocytes 0 %   Abs Immature Granulocytes 0.01 0.00 - 0.07 K/uL       RADIOGRAPHIC STUDIES:  I have personally reviewed the radiological images as listed and agree with the findings in the report.  CT CHEST ABDOMEN PELVIS W CONTRAST Result Date: 01/10/2024 CLINICAL DATA:  History of thyroid cancer.  * Tracking Code: BO * EXAM: CT CHEST, ABDOMEN, AND PELVIS WITH CONTRAST TECHNIQUE: Multidetector CT imaging of the chest, abdomen and pelvis was performed following the standard protocol during bolus administration of intravenous contrast. RADIATION DOSE REDUCTION: This exam was performed according to the departmental dose-optimization program which includes automated exposure control, adjustment of the mA and/or kV according to patient size and/or use of iterative reconstruction technique. CONTRAST:  ISOVUE-300 IOPAMIDOL (ISOVUE-300) INJECTION 61% COMPARISON:  Multiple priors including CT October 26, 2023 FINDINGS: CT CHEST FINDINGS Cardiovascular: Normal caliber thoracic aorta. Normal size heart. No significant pericardial effusion/thickening. Mediastinum/Nodes: Thyroid gland is surgically absent. No pathologically enlarged mediastinal, hilar or axillary lymph nodes. The esophagus is grossly unremarkable. Lungs/Pleura: Stable 5 mm pulmonary nodule in the lingula on image 72/4. No new suspicious pulmonary nodules or masses. No pleural effusion. No pneumothorax.  Musculoskeletal: No aggressive lytic or blastic lesion of bone. CT ABDOMEN PELVIS FINDINGS Hepatobiliary: No suspicious hepatic lesion. Gallbladder is unremarkable. No biliary ductal dilation. Pancreas: No pancreatic ductal dilation or evidence of acute inflammation. Spleen: No splenomegaly. Adrenals/Urinary Tract: Bilateral adrenal glands appear normal. No hydronephrosis. Kidneys demonstrate symmetric enhancement. Urinary bladder is unremarkable for degree of distension. Stomach/Bowel: Stomach is within normal limits. Appendix appears normal. No evidence of bowel wall thickening, distention, or inflammatory changes. Vascular/Lymphatic: No significant vascular findings are present. No enlarged abdominal or pelvic lymph nodes. Reproductive: Lobular uterine contour commonly reflects leiomyomas. Other: No significant abdominopelvic free fluid. Musculoskeletal: No aggressive lytic or blastic lesion of bone. IMPRESSION: 1. Stable 5 mm pulmonary nodule in the lingula. No new suspicious pulmonary nodules or masses. 2. No evidence of metastatic disease in the abdomen or pelvis. 3. Lobular uterine contour commonly reflects leiomyomas. Electronically Signed   By: Maudry Mayhew M.D.   On: 01/10/2024 17:21   NM Whole Body I131 Scan S/P Ca Rx Result Date: 01/04/2024 CLINICAL DATA:  papillary thyroid carcinoma. Thyroidectomy remain ablation 2011. Elevated thyroglobulin level. Concern for biochemical recurrence of thyroid carcinoma. EXAM: NUCLEAR MEDICINE I-131 POST THERAPY WHOLE BODY SCAN TECHNIQUE: The patient received 136.7 millicuries mCi I-131 sodium iodide for the treatment of thyroid cancer within the past 10 days. The patient returns today, and whole body scanning was performed in the anterior and posterior projections. COMPARISON:  CT  neck 10/26/2023 FINDINGS: No focal activity within the thyroid bed. There is focus of radiotracer activity within the mid lateral LEFT chest. Second focus of more intense activity in the  posterior LEFT pelvic region. Mild uptake within the liver suggest metabolism of thyroid hormone products. IMPRESSION: Findings most consistent with metastatic thyroid carcinoma. Lesions in the LEFT chest and posterior LEFT pelvis. Differential would include skeletal metastasis versus skeletal metastasis and lung metastasis. Recommend CT of the chest abdomen pelvis. These results will be called to the ordering clinician or representative by the Radiologist Assistant, and communication documented in the PACS or [employer]. Electronically Signed   By: Genevive Bi M.D.   On: 01/04/2024 11:12    Orders Placed This Encounter  Procedures   CBC with Differential (Cancer Center Only)    Standing Status:   Future    Number of Occurrences:   1    Expiration Date:   01/24/2025   CMP (Cancer Center only)    Standing Status:   Future    Number of Occurrences:   1    Expiration Date:   01/24/2025   Magnesium    Standing Status:   Future    Number of Occurrences:   1    Expiration Date:   01/24/2025   Phosphorus    Standing Status:   Future    Number of Occurrences:   1    Expiration Date:   01/24/2025    CODE STATUS:   Future Appointments  Date Time Provider Department Center  02/01/2024  8:00 AM [facility] None  02/01/2024  8:30 AM [facility]USH [facility] None  02/29/2024 10:00 AM [facility] None  02/29/2024 10:30 AM Liliann File, MD [facility] None  02/29/2024 11:00 AM [facility] FLUSH [facility] None     I spent a total of 60 minutes during this encounter with the patient including review of chart and various tests results, discussions about plan of care and coordination of care plan.  This document was completed utilizing speech recognition software. Grammatical errors, random word insertions, pronoun errors, and incomplete sentences are an occasional consequence of this system due to software limitations, ambient noise, and hardware issues.  Any formal questions or concerns about the content, text or information contained within the body of this dictation should be directly addressed to the provider for clarification.

## 2024-01-25 NOTE — Assessment & Plan Note (Signed)
 Seizures have been present since 1991 and are currently well-controlled with medication. No recent seizure activity reported.

## 2024-02-01 ENCOUNTER — Inpatient Hospital Stay

## 2024-02-01 ENCOUNTER — Other Ambulatory Visit: Payer: Self-pay

## 2024-02-01 VITALS — BP 131/80 | HR 67 | Temp 98.3°F | Resp 16

## 2024-02-01 DIAGNOSIS — C73 Malignant neoplasm of thyroid gland: Secondary | ICD-10-CM

## 2024-02-01 DIAGNOSIS — C7951 Secondary malignant neoplasm of bone: Secondary | ICD-10-CM | POA: Diagnosis not present

## 2024-02-01 LAB — CBC WITH DIFFERENTIAL (CANCER CENTER ONLY)
Abs Immature Granulocytes: 0.02 10*3/uL (ref 0.00–0.07)
Basophils Absolute: 0 10*3/uL (ref 0.0–0.1)
Basophils Relative: 0 %
Eosinophils Absolute: 0.1 10*3/uL (ref 0.0–0.5)
Eosinophils Relative: 3 %
HCT: 38 % (ref 36.0–46.0)
Hemoglobin: 12.4 g/dL (ref 12.0–15.0)
Immature Granulocytes: 1 %
Lymphocytes Relative: 31 %
Lymphs Abs: 1 10*3/uL (ref 0.7–4.0)
MCH: 28.1 pg (ref 26.0–34.0)
MCHC: 32.6 g/dL (ref 30.0–36.0)
MCV: 86 fL (ref 80.0–100.0)
Monocytes Absolute: 0.3 10*3/uL (ref 0.1–1.0)
Monocytes Relative: 8 %
Neutro Abs: 1.9 10*3/uL (ref 1.7–7.7)
Neutrophils Relative %: 57 %
Platelet Count: 213 10*3/uL (ref 150–400)
RBC: 4.42 MIL/uL (ref 3.87–5.11)
RDW: 12.9 % (ref 11.5–15.5)
WBC Count: 3.3 10*3/uL — ABNORMAL LOW (ref 4.0–10.5)
nRBC: 0 % (ref 0.0–0.2)

## 2024-02-01 LAB — CMP (CANCER CENTER ONLY)
ALT: 16 U/L (ref 0–44)
AST: 14 U/L — ABNORMAL LOW (ref 15–41)
Albumin: 4.5 g/dL (ref 3.5–5.0)
Alkaline Phosphatase: 99 U/L (ref 38–126)
Anion gap: 3 — ABNORMAL LOW (ref 5–15)
BUN: 16 mg/dL (ref 6–20)
CO2: 28 mmol/L (ref 22–32)
Calcium: 9 mg/dL (ref 8.9–10.3)
Chloride: 109 mmol/L (ref 98–111)
Creatinine: 0.92 mg/dL (ref 0.44–1.00)
GFR, Estimated: >60 mL/min (ref >60)
Glucose, Bld: 83 mg/dL (ref 70–99)
Potassium: 4.6 mmol/L (ref 3.5–5.1)
Sodium: 140 mmol/L (ref 135–145)
Total Bilirubin: 0.4 mg/dL (ref 0.0–1.2)
Total Protein: 7.4 g/dL (ref 6.5–8.1)

## 2024-02-01 LAB — MAGNESIUM: Magnesium: 2.1 mg/dL (ref 1.7–2.4)

## 2024-02-01 LAB — PHOSPHORUS: Phosphorus: 3.4 mg/dL (ref 2.5–4.6)

## 2024-02-01 MED ORDER — DENOSUMAB 120 MG/1.7ML ~~LOC~~ SOLN
120.0000 mg | Freq: Once | SUBCUTANEOUS | Status: AC
  Administered 2024-02-01: 120 mg via SUBCUTANEOUS
  Filled 2024-02-01: qty 1.7

## 2024-02-01 NOTE — Progress Notes (Signed)
 Ok to proceed with Rivka Barbara today with dental clearance pending.  Anola Gurney [place], [place], BCPS, BCOP 02/01/2024 10:09 AM

## 2024-02-01 NOTE — Progress Notes (Signed)
 Dr. Arlana Pouch notified about pt's corr calcium level of 8.6. He stated okay to proceed with injection and to advise patient to take calcium supplements 600 mg orally daily, if she is not already taking. Pt made aware, verbalized understanding.

## 2024-02-05 ENCOUNTER — Encounter: Payer: Self-pay | Admitting: Oncology

## 2024-02-28 ENCOUNTER — Other Ambulatory Visit: Payer: Self-pay | Admitting: Oncology

## 2024-02-28 DIAGNOSIS — C7951 Secondary malignant neoplasm of bone: Secondary | ICD-10-CM

## 2024-02-29 ENCOUNTER — Encounter: Payer: Self-pay | Admitting: Oncology

## 2024-02-29 ENCOUNTER — Inpatient Hospital Stay: Admitting: Oncology

## 2024-02-29 ENCOUNTER — Inpatient Hospital Stay: Attending: Oncology

## 2024-02-29 ENCOUNTER — Inpatient Hospital Stay

## 2024-02-29 VITALS — BP 143/92 | HR 73 | Temp 98.4°F | Resp 18

## 2024-02-29 VITALS — BP 136/88 | HR 80 | Temp 97.3°F | Resp 16 | Wt 168.0 lb

## 2024-02-29 DIAGNOSIS — E890 Postprocedural hypothyroidism: Secondary | ICD-10-CM | POA: Insufficient documentation

## 2024-02-29 DIAGNOSIS — C73 Malignant neoplasm of thyroid gland: Secondary | ICD-10-CM | POA: Diagnosis not present

## 2024-02-29 DIAGNOSIS — Z85850 Personal history of malignant neoplasm of thyroid: Secondary | ICD-10-CM | POA: Diagnosis present

## 2024-02-29 DIAGNOSIS — Z8639 Personal history of other endocrine, nutritional and metabolic disease: Secondary | ICD-10-CM

## 2024-02-29 DIAGNOSIS — E559 Vitamin D deficiency, unspecified: Secondary | ICD-10-CM | POA: Diagnosis not present

## 2024-02-29 DIAGNOSIS — R918 Other nonspecific abnormal finding of lung field: Secondary | ICD-10-CM | POA: Diagnosis not present

## 2024-02-29 DIAGNOSIS — Z79899 Other long term (current) drug therapy: Secondary | ICD-10-CM | POA: Diagnosis not present

## 2024-02-29 DIAGNOSIS — C7951 Secondary malignant neoplasm of bone: Secondary | ICD-10-CM

## 2024-02-29 LAB — CBC WITH DIFFERENTIAL (CANCER CENTER ONLY)
Abs Immature Granulocytes: 0.01 10*3/uL (ref 0.00–0.07)
Basophils Absolute: 0 10*3/uL (ref 0.0–0.1)
Basophils Relative: 0 %
Eosinophils Absolute: 0.1 10*3/uL (ref 0.0–0.5)
Eosinophils Relative: 2 %
HCT: 38.1 % (ref 36.0–46.0)
Hemoglobin: 12.5 g/dL (ref 12.0–15.0)
Immature Granulocytes: 0 %
Lymphocytes Relative: 32 %
Lymphs Abs: 1.5 10*3/uL (ref 0.7–4.0)
MCH: 27.8 pg (ref 26.0–34.0)
MCHC: 32.8 g/dL (ref 30.0–36.0)
MCV: 84.7 fL (ref 80.0–100.0)
Monocytes Absolute: 0.5 10*3/uL (ref 0.1–1.0)
Monocytes Relative: 10 %
Neutro Abs: 2.6 10*3/uL (ref 1.7–7.7)
Neutrophils Relative %: 56 %
Platelet Count: 224 10*3/uL (ref 150–400)
RBC: 4.5 MIL/uL (ref 3.87–5.11)
RDW: 13.4 % (ref 11.5–15.5)
WBC Count: 4.7 10*3/uL (ref 4.0–10.5)
nRBC: 0 % (ref 0.0–0.2)

## 2024-02-29 LAB — CMP (CANCER CENTER ONLY)
ALT: 10 U/L (ref 0–44)
AST: 14 U/L — ABNORMAL LOW (ref 15–41)
Albumin: 4.5 g/dL (ref 3.5–5.0)
Alkaline Phosphatase: 79 U/L (ref 38–126)
Anion gap: 2 — ABNORMAL LOW (ref 5–15)
BUN: 17 mg/dL (ref 6–20)
CO2: 30 mmol/L (ref 22–32)
Calcium: 9 mg/dL (ref 8.9–10.3)
Chloride: 108 mmol/L (ref 98–111)
Creatinine: 0.83 mg/dL (ref 0.44–1.00)
GFR, Estimated: >60 mL/min (ref >60)
Glucose, Bld: 84 mg/dL (ref 70–99)
Potassium: 4.5 mmol/L (ref 3.5–5.1)
Sodium: 140 mmol/L (ref 135–145)
Total Bilirubin: 0.4 mg/dL (ref 0.0–1.2)
Total Protein: 7 g/dL (ref 6.5–8.1)

## 2024-02-29 LAB — MAGNESIUM: Magnesium: 2.1 mg/dL (ref 1.7–2.4)

## 2024-02-29 MED ORDER — DENOSUMAB 120 MG/1.7ML ~~LOC~~ SOLN
120.0000 mg | Freq: Once | SUBCUTANEOUS | Status: AC
  Administered 2024-02-29: 120 mg via SUBCUTANEOUS
  Filled 2024-02-29: qty 1.7

## 2024-02-29 NOTE — Assessment & Plan Note (Signed)
 Papillary thyroid  cancer, initially diagnosed in 2011, was treated with total thyroidectomy and radioactive iodine therapy.   She has been under regular monitoring with scans every six months. Recent scans have shown a slow-growing lung nodule in June 2024, which led to follow-up scans in January 2025. Lung nodules have minimally increased from 4mm to 5mm.   She later had a whole-body I-131 scan on 01/04/2024 which showed activity in the bones, particularly in the left pelvic area and left chest.  Lung nodules were not prominent on this scan.   Disease burden is low, but bone involvement is concerning.    Xgeva  (denosumab ) was proposed to control bone disease and prevent fractures. Risks include hypocalcemia and osteonecrosis of the jaw. Calcium and vitamin D supplementation are necessary to mitigate hypocalcemia.     Treatment duration is indefinite, but a minimum of one year is planned, with regular scans to monitor disease progression.   Once dental clearance was obtained, she was started on Xgeva  from 02/01/2024, with plan to continue every 4 weeks.  Labs today reveal no dose-limiting toxicities.  Will proceed with Xgeva  dose today.  - Continue regular scans at endocrinology office to monitor disease status.  RTC in 4 weeks for labs, follow-up and continuation of Xgeva  injections.

## 2024-02-29 NOTE — Assessment & Plan Note (Signed)
 Chronic high-dose vitamin D supplementation for at least three years poses a risk of overdose due to prolonged use of 50,000 units weekly. Vitamin D is stored in the body, and excessive levels can be harmful. Current supplementation may not be necessary given the duration and potential for adequate storage. Over-the-counter calcium with vitamin D supplements like Citracal may suffice. - Discontinue high-dose vitamin D supplementation - Order vitamin D level at next visit to assess need for continued supplementation - Consider over-the-counter calcium with vitamin D supplements like Citracal

## 2024-02-29 NOTE — Progress Notes (Signed)
 [facility]  ONCOLOGY CLINIC PROGRESS NOTE   Patient Care Team: Charle Congo, MD as PCP - General (Internal Medicine) Gordy Lauber, MD as Consulting Physician (Endocrinology)  PATIENT NAME: Tammy [facility]   MR#: [id number] DOB: [birth year]  Date of visit: 02/29/2024   ASSESSMENT & PLAN:   Tammy [facility] is a 53 y.o.  lady with a past medical history of papillary thyroid  cancer diagnosed in 2011 status post thyroidectomy followed by radioactive iodine therapy, remained on close surveillance under endocrinology department, was recently found to have bone metastatic disease on bone scan.  She was referred to our clinic in March 2025 for consideration of Xgeva  injections.  Her other comorbidities include seizure disorder.   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   She has been under regular monitoring with scans every six months. Recent scans have shown a slow-growing lung nodule in June 2024, which led to follow-up scans in January 2025. Lung nodules have minimally increased from 4mm to 5mm.   She later had a whole-body I-131 scan on 01/04/2024 which showed activity in the bones, particularly in the left pelvic area and left chest.  Lung nodules were not prominent on this scan.   Disease burden is low, but bone involvement is concerning.    Xgeva  (denosumab ) was proposed to control bone disease and prevent fractures. Risks include hypocalcemia and osteonecrosis of the jaw. Calcium and vitamin D supplementation are necessary to mitigate hypocalcemia.     Treatment duration is indefinite, but a minimum of one year is planned, with regular scans to monitor disease progression.   Once dental clearance was obtained, she was started on Xgeva  from 02/01/2024, with plan to continue every 4 weeks.  Labs today reveal no dose-limiting  toxicities.  Will proceed with Xgeva  dose today.  - Continue regular scans at endocrinology office to monitor disease status.  RTC in 4 weeks for labs, follow-up and continuation of Xgeva  injections.  H/O vitamin D deficiency Chronic high-dose vitamin D supplementation for at least three years poses a risk of overdose due to prolonged use of 50,000 units weekly. Vitamin D is stored in the body, and excessive levels can be harmful. Current supplementation may not be necessary given the duration and potential for adequate storage. Over-the-counter calcium with vitamin D supplements like Citracal may suffice. - Discontinue high-dose vitamin D supplementation - Order vitamin D level at next visit to assess need for continued supplementation - Consider over-the-counter calcium with vitamin D supplements like Citracal    I reviewed lab results and outside records for this visit and discussed relevant results with the patient. Diagnosis, plan of care and treatment options were also discussed in detail with the patient. Opportunity provided to ask questions and answers provided to her apparent satisfaction. Provided instructions to call our clinic with any problems, questions or concerns prior to return visit. I recommended to continue follow-up with PCP and sub-specialists. She verbalized understanding and agreed with the plan.   NCCN guidelines have been consulted in the planning of this patient's care.  I spent a total of 30 minutes during this encounter with the patient including review of chart and various tests results, discussions about plan of care and coordination of care plan.   Tammy Berber, MD  02/29/2024 5:06 PM  [facility] - [facility]  [facility] [address] 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  INTERVAL HISTORY:    Discussed the use of AI scribe software for clinical note transcription with the patient, who gave verbal consent to proceed.  History of Present Illness  She experiences significant pain centered in the hip area following a recent injection. The pain was severe enough last night to require two Tylenol , which did not alleviate her symptoms.  She feels unusually tired this week, attributing it to a lack of sleep, having only managed about three hours of sleep recently. Her recent blood work showed a previously low white blood cell count, which has since normalized.  She continues to take calcium supplements and high-dose vitamin D once a week, a regimen she has been following for several years. She is unsure if the vitamin D could be contributing to her symptoms.  She is under the care of an endocrinologist, visiting every six months for thyroid  level checks, with her next appointment scheduled for June.  No changes that could have caused the pain aside from the injection. Her blood work, including kidney and liver function tests, electrolytes, and platelet counts, are reported as normal.    I have reviewed the past medical history, past surgical history, social history and family history with the patient and they are unchanged from previous note.  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  She underwent thyroidectomy followed by radioactive iodine therapy.    She has been under regular monitoring with scans every six months. Recent scans have shown a slow-growing lung nodule in June 2024, which led to follow-up scans in January 2025.  She later had a whole-body I-131 scan on 01/04/2024 which showed activity in the bones, particularly in the left pelvic area and left  chest.  Lung nodules were not prominent on this scan.   She was referred to us  in March 2025 for consideration of Xgeva  injections for bone metastatic disease from papillary thyroid  cancer.  Disease burden is low, but bone involvement is concerning.    Xgeva  (denosumab ) was proposed to control bone disease and prevent fractures. Risks include hypocalcemia and osteonecrosis of the jaw. Calcium and vitamin D supplementation are necessary to mitigate hypocalcemia.     Treatment duration is indefinite, but a minimum of one year is planned, with regular scans to monitor disease progression.   Once dental clearance was obtained, she was started on Xgeva  from 02/01/2024, with plan to continue every 4 weeks.  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  ALLERGIES: She is allergic to other, soap, and amoxicillin.  MEDICATIONS:  Current Outpatient Medications  Medication Sig Dispense  Refill   lamoTRIgine (LAMICTAL) 100 MG tablet Take 100 mg by mouth daily.     levothyroxine (SYNTHROID, LEVOTHROID) 112 MCG tablet Take 224 mcg by mouth daily.     Vitamin D, Ergocalciferol, (DRISDOL) 1.25 MG (50000 UNIT) CAPS capsule Take 50,000 Units by mouth every 7 (seven) days.     No current facility-administered medications for this visit.     VITALS:   Blood pressure 136/88, pulse 80, temperature (!) 97.3 F (36.3 C), temperature source Temporal, resp. rate 16, weight 168 lb  (76.2 kg), SpO2 97%.  Wt Readings from Last 3 Encounters:  02/29/24 168 lb (76.2 kg)  01/25/24 166 lb 6.4 oz (75.5 kg)  09/27/22 190 lb (86.2 kg)    Body mass index is 26.31 kg/m.    Onc Performance Status - 02/29/24 1047       ECOG Perf Status   ECOG Perf Status Ambulatory and capable of all selfcare but unable to carry out any work activities.  Up and about more than 50% of waking hours      KPS SCALE   KPS % SCORE Normal activity with effort, some s/s of disease             PHYSICAL EXAM:   Physical Exam Constitutional:      General: She is not in acute distress.    Appearance: Normal appearance.  HENT:     Head: Normocephalic and atraumatic.  Eyes:     General: No scleral icterus.    Conjunctiva/sclera: Conjunctivae normal.  Cardiovascular:     Rate and Rhythm: Normal rate and regular rhythm.     Heart sounds: Normal heart sounds.  Pulmonary:     Effort: Pulmonary effort is normal.     Breath sounds: Normal breath sounds.  Abdominal:     General: There is no distension.  Musculoskeletal:     Right lower leg: No edema.     Left lower leg: No edema.  Neurological:     General: No focal deficit present.     Mental Status: She is alert and oriented to person, place, and time.  Psychiatric:        Mood and Affect: Mood normal.        Behavior: Behavior normal.        Thought Content: Thought content normal.       LABORATORY DATA:   I have reviewed the data as listed.  Results for orders placed or performed in visit on 02/29/24  Magnesium  Result Value Ref Range   Magnesium 2.1 1.7 - 2.4 mg/dL  CMP ([facility])  Result Value Ref Range   Sodium 140 135 - 145 mmol/L   Potassium 4.5 3.5 - 5.1 mmol/L   Chloride 108 98 - 111 mmol/L   CO2 30 22 - 32 mmol/L   Glucose, Bld 84 70 - 99 mg/dL   BUN 17 6 - 20 mg/dL   Creatinine 1.61 0.96 - 1.00 mg/dL   Calcium 9.0 8.9 - 04.5 mg/dL   Total Protein 7.0 6.5 - 8.1 g/dL   Albumin 4.5 3.5 - 5.0 g/dL    AST 14 (L) 15 - 41 U/L   ALT 10 0 - 44 U/L   Alkaline Phosphatase 79 38 - 126 U/L   Total Bilirubin 0.4 0.0 - 1.2 mg/dL   GFR, Estimated >40 >98 mL/min   Anion gap 2 (L) 5 - 15  CBC with Differential ([facility] Only)  Result Value Ref Range  WBC Count 4.7 4.0 - 10.5 K/uL   RBC 4.50 3.87 - 5.11 MIL/uL   Hemoglobin 12.5 12.0 - 15.0 g/dL   HCT 28.4 13.2 - 44.0 %   MCV 84.7 80.0 - 100.0 fL   MCH 27.8 26.0 - 34.0 pg   MCHC 32.8 30.0 - 36.0 g/dL   RDW 10.2 72.5 - 36.6 %   Platelet Count 224 150 - 400 K/uL   nRBC 0.0 0.0 - 0.2 %   Neutrophils Relative % 56 %   Neutro Abs 2.6 1.7 - 7.7 K/uL   Lymphocytes Relative 32 %   Lymphs Abs 1.5 0.7 - 4.0 K/uL   Monocytes Relative 10 %   Monocytes Absolute 0.5 0.1 - 1.0 K/uL   Eosinophils Relative 2 %   Eosinophils Absolute 0.1 0.0 - 0.5 K/uL   Basophils Relative 0 %   Basophils Absolute 0.0 0.0 - 0.1 K/uL   Immature Granulocytes 0 %   Abs Immature Granulocytes 0.01 0.00 - 0.07 K/uL      RADIOGRAPHIC STUDIES:  I have reviewed CT chest abdomen pelvis from 01/10/2024 and whole-body I-131 scan from 01/04/2024.  CODE STATUS:   Orders Placed This Encounter  Procedures   CBC with Differential ([facility] Only)    Standing Status:   Standing    Number of Occurrences:   11    Expiration Date:   02/28/2025   CMP ([facility])    Standing Status:   Standing    Number of Occurrences:   11    Expiration Date:   02/28/2025   Magnesium    Standing Status:   Standing    Number of Occurrences:   11    Expiration Date:   02/28/2025   Vitamin D 25 hydroxy    Standing Status:   Future    Expected Date:   03/30/2024    Expiration Date:   02/28/2025      This document was completed utilizing speech recognition software. Grammatical errors, random word insertions, pronoun errors, and incomplete sentences are an occasional consequence of this system due to software limitations, ambient noise, and hardware issues. Any formal questions or  concerns about the content, text or information contained within the body of this dictation should be directly addressed to the provider for clarification.

## 2024-03-28 ENCOUNTER — Inpatient Hospital Stay: Attending: Oncology

## 2024-03-28 ENCOUNTER — Inpatient Hospital Stay

## 2024-03-28 ENCOUNTER — Encounter: Payer: Self-pay | Admitting: Oncology

## 2024-03-28 ENCOUNTER — Inpatient Hospital Stay (HOSPITAL_BASED_OUTPATIENT_CLINIC_OR_DEPARTMENT_OTHER): Admitting: Oncology

## 2024-03-28 VITALS — BP 133/78 | HR 71 | Temp 97.9°F | Resp 20 | Wt 171.0 lb

## 2024-03-28 DIAGNOSIS — Z79899 Other long term (current) drug therapy: Secondary | ICD-10-CM | POA: Insufficient documentation

## 2024-03-28 DIAGNOSIS — C7951 Secondary malignant neoplasm of bone: Secondary | ICD-10-CM | POA: Insufficient documentation

## 2024-03-28 DIAGNOSIS — C73 Malignant neoplasm of thyroid gland: Secondary | ICD-10-CM | POA: Diagnosis not present

## 2024-03-28 DIAGNOSIS — G40909 Epilepsy, unspecified, not intractable, without status epilepticus: Secondary | ICD-10-CM | POA: Diagnosis not present

## 2024-03-28 DIAGNOSIS — Z85850 Personal history of malignant neoplasm of thyroid: Secondary | ICD-10-CM | POA: Insufficient documentation

## 2024-03-28 LAB — CMP (CANCER CENTER ONLY)
ALT: 10 U/L (ref 0–44)
AST: 15 U/L (ref 15–41)
Albumin: 4.4 g/dL (ref 3.5–5.0)
Alkaline Phosphatase: 73 U/L (ref 38–126)
Anion gap: 4 — ABNORMAL LOW (ref 5–15)
BUN: 15 mg/dL (ref 6–20)
CO2: 28 mmol/L (ref 22–32)
Calcium: 8.6 mg/dL — ABNORMAL LOW (ref 8.9–10.3)
Chloride: 108 mmol/L (ref 98–111)
Creatinine: 0.79 mg/dL (ref 0.44–1.00)
GFR, Estimated: >60 mL/min (ref >60)
Glucose, Bld: 83 mg/dL (ref 70–99)
Potassium: 4.2 mmol/L (ref 3.5–5.1)
Sodium: 140 mmol/L (ref 135–145)
Total Bilirubin: 0.4 mg/dL (ref 0.0–1.2)
Total Protein: 7.2 g/dL (ref 6.5–8.1)

## 2024-03-28 LAB — CBC WITH DIFFERENTIAL (CANCER CENTER ONLY)
Abs Immature Granulocytes: 0.01 10*3/uL (ref 0.00–0.07)
Basophils Absolute: 0 10*3/uL (ref 0.0–0.1)
Basophils Relative: 1 %
Eosinophils Absolute: 0.1 10*3/uL (ref 0.0–0.5)
Eosinophils Relative: 2 %
HCT: 36.5 % (ref 36.0–46.0)
Hemoglobin: 12.3 g/dL (ref 12.0–15.0)
Immature Granulocytes: 0 %
Lymphocytes Relative: 38 %
Lymphs Abs: 1.5 10*3/uL (ref 0.7–4.0)
MCH: 28.1 pg (ref 26.0–34.0)
MCHC: 33.7 g/dL (ref 30.0–36.0)
MCV: 83.5 fL (ref 80.0–100.0)
Monocytes Absolute: 0.4 10*3/uL (ref 0.1–1.0)
Monocytes Relative: 10 %
Neutro Abs: 2 10*3/uL (ref 1.7–7.7)
Neutrophils Relative %: 49 %
Platelet Count: 193 10*3/uL (ref 150–400)
RBC: 4.37 MIL/uL (ref 3.87–5.11)
RDW: 13 % (ref 11.5–15.5)
WBC Count: 4 10*3/uL (ref 4.0–10.5)
nRBC: 0 % (ref 0.0–0.2)

## 2024-03-28 LAB — MAGNESIUM: Magnesium: 1.9 mg/dL (ref 1.7–2.4)

## 2024-03-28 LAB — VITAMIN D 25 HYDROXY (VIT D DEFICIENCY, FRACTURES): Vit D, 25-Hydroxy: 46.78 ng/mL (ref 30–100)

## 2024-03-28 MED ORDER — DENOSUMAB 120 MG/1.7ML ~~LOC~~ SOLN
120.0000 mg | Freq: Once | SUBCUTANEOUS | Status: AC
  Administered 2024-03-28: 120 mg via SUBCUTANEOUS
  Filled 2024-03-28: qty 1.7

## 2024-03-28 NOTE — Assessment & Plan Note (Addendum)
 Papillary thyroid  cancer, initially diagnosed in 2011, was treated with total thyroidectomy and radioactive iodine therapy.   She has been under regular monitoring with scans every six months. Recent scans have shown a slow-growing lung nodule in June 2024, which led to follow-up scans in January 2025. Lung nodules have minimally increased from 4mm to 5mm.   She later had a whole-body I-131 scan on 01/04/2024 which showed activity in the bones, particularly in the left pelvic area and left chest.  Lung nodules were not prominent on this scan.   Disease burden is low, but bone involvement is concerning.    Xgeva  (denosumab ) was proposed to control bone disease and prevent fractures. Risks include hypocalcemia and osteonecrosis of the jaw. Calcium and vitamin D supplementation are necessary to mitigate hypocalcemia.     Treatment duration is indefinite, but a minimum of one year is planned, with regular scans to monitor disease progression.   Once dental clearance was obtained, she was started on Xgeva  from 02/01/2024, with plan to continue every 4 weeks.  Labs today reveal mild hypocalcemia with calcium of 8.6 today, otherwise no dose-limiting toxicities.  Will proceed with Xgeva  dose today.  She was advised to increase oral calcium at home to 600 mg p.o. twice daily dosing.  We will recheck calcium and other labs on return visit.  - Continue regular scans at endocrinology office to monitor disease status.  RTC in 4 weeks for labs, follow-up and continuation of Xgeva  injections.

## 2024-03-28 NOTE — Progress Notes (Signed)
 Ok to proceed with Xgeva  with Ca 8.6, Albumin 4.4.  Jobe Mulder [place], [place], BCPS, BCOP 03/28/2024 11:18 AM

## 2024-03-28 NOTE — Progress Notes (Signed)
 [facility]  ONCOLOGY CLINIC PROGRESS NOTE   Patient Care Team: Charle Congo, MD as PCP - General (Internal Medicine) Gordy Lauber, MD as Consulting Physician (Endocrinology)  PATIENT NAME: Tammy Nichols   MR#: [id number] DOB: Jan 29, 1971  Date of visit: 03/28/2024   ASSESSMENT & PLAN:   PATRECIA VEIGA is a 53 y.o.  lady with a past medical history of papillary thyroid  cancer diagnosed in 2011 status post thyroidectomy followed by radioactive iodine therapy, remained on close surveillance under endocrinology department, was recently found to have bone metastatic disease on bone scan.  She was referred to our clinic in March 2025 for consideration of Xgeva  injections.  Her other comorbidities include seizure disorder.   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   She has been under regular monitoring with scans every six months. Recent scans have shown a slow-growing lung nodule in June 2024, which led to follow-up scans in January 2025. Lung nodules have minimally increased from 4mm to 5mm.   She later had a whole-body I-131 scan on 01/04/2024 which showed activity in the bones, particularly in the left pelvic area and left chest.  Lung nodules were not prominent on this scan.   Disease burden is low, but bone involvement is concerning.    Xgeva  (denosumab ) was proposed to control bone disease and prevent fractures. Risks include hypocalcemia and osteonecrosis of the jaw. Calcium and vitamin D supplementation are necessary to mitigate hypocalcemia.     Treatment duration is indefinite, but a minimum of one year is planned, with regular scans to monitor disease progression.   Once dental clearance was obtained, she was started on Xgeva  from 02/01/2024, with plan to continue every 4 weeks.  Labs today reveal mild hypocalcemia  with calcium of 8.6 today, otherwise no dose-limiting toxicities.  Will proceed with Xgeva  dose today.  She was advised to increase oral calcium at home to 600 mg p.o. twice daily dosing.  We will recheck calcium and other labs on return visit.  - Continue regular scans at endocrinology office to monitor disease status.  RTC in 4 weeks for labs, follow-up and continuation of Xgeva  injections.     I reviewed lab results and outside records for this visit and discussed relevant results with the patient. Diagnosis, plan of care and treatment options were also discussed in detail with the patient. Opportunity provided to ask questions and answers provided to her apparent satisfaction. Provided instructions to call our clinic with any problems, questions or concerns prior to return visit. I recommended to continue follow-up with PCP and sub-specialists. She verbalized understanding and agreed with the plan.   NCCN guidelines have been consulted in the planning of this patient's care.  I spent a total of 30 minutes during this encounter with the patient including review of chart and various tests results, discussions about plan of care and coordination of care plan.   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  INTERVAL HISTORY:    Discussed the use of AI scribe software  for clinical note transcription with the patient, who gave verbal consent to proceed.  History of Present Illness  She experiences significant pain centered in the hip area following a recent injection. The pain was severe enough last night to require two Tylenol , which did  not alleviate her symptoms.  She feels unusually tired this week, attributing it to a lack of sleep, having only managed about three hours of sleep recently. Her recent blood work showed a previously low white blood cell count, which has since normalized.  She continues to take calcium supplements and high-dose vitamin D once a week, a regimen she has been following for several years. She is unsure if the vitamin D could be contributing to her symptoms.  She is under the care of an endocrinologist, visiting every six months for thyroid  level checks, with her next appointment scheduled for June.  No changes that could have caused the pain aside from the injection. Her blood work, including kidney and liver function tests, electrolytes, and platelet counts, are reported as normal.     I have reviewed the past medical history, past surgical history, social history and family history with the patient and they are unchanged from previous note.  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  She underwent thyroidectomy followed by radioactive iodine therapy.    She has been under regular monitoring with scans every six months. Recent scans have shown a slow-growing lung nodule in June 2024, which led to follow-up scans in January 2025.  She later had a whole-body I-131 scan on 01/04/2024 which showed activity in the bones, particularly in the left pelvic area and left chest.  Lung nodules were not prominent on this scan.   She was referred to us  in March 2025 for consideration of Xgeva  injections for bone metastatic disease from papillary thyroid  cancer.  Disease burden is low, but bone involvement is concerning.    Xgeva  (denosumab ) was proposed to control bone disease and prevent fractures. Risks include hypocalcemia and osteonecrosis of the jaw. Calcium and vitamin D supplementation are  necessary to mitigate hypocalcemia.     Treatment duration is indefinite, but a minimum of one year is planned, with regular scans to monitor disease progression.   Once dental clearance was obtained, she was started on Xgeva  from 02/01/2024, with plan to continue every 4 weeks.  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  ALLERGIES: She is allergic to other, soap, and amoxicillin.  MEDICATIONS:  Current Outpatient Medications  Medication Sig Dispense Refill   lamoTRIgine (LAMICTAL) 100 MG tablet Take 100 mg by mouth daily.     levothyroxine (SYNTHROID, LEVOTHROID) 112 MCG tablet Take 224 mcg by mouth daily.     Vitamin D, Ergocalciferol, (DRISDOL) 1.25 MG (50000 UNIT) CAPS capsule Take 50,000 Units by mouth every 7 (seven) days.  No current facility-administered medications for this visit.     VITALS:   Blood pressure 133/78, pulse 71, temperature 97.9 F (36.6 C), resp. rate 20, weight 171 lb (77.6 kg), SpO2 97%.  Wt Readings from Last 3 Encounters:  03/28/24 171 lb (77.6 kg)  02/29/24 168 lb (76.2 kg)  01/25/24 166 lb 6.4 oz (75.5 kg)    Body mass index is 26.78 kg/m.    Onc Performance Status - 03/28/24 1200       ECOG Perf Status   ECOG Perf Status Restricted in physically strenuous activity but ambulatory and able to carry out work of a light or sedentary nature, e.g., light house work, office work      KPS SCALE   KPS % SCORE Able  to carry on normal activity, minor s/s of disease              PHYSICAL EXAM:   Physical Exam Constitutional:      General: She is not in acute distress.    Appearance: Normal appearance.  HENT:     Head: Normocephalic and atraumatic.  Eyes:     General: No scleral icterus.    Conjunctiva/sclera: Conjunctivae normal.  Cardiovascular:     Rate and Rhythm: Normal rate and regular rhythm.     Heart sounds: Normal heart sounds.  Pulmonary:     Effort: Pulmonary effort is normal.     Breath sounds: Normal breath sounds.  Abdominal:     General: There is no distension.  Musculoskeletal:     Right lower leg: No edema.     Left lower leg: No edema.  Neurological:     General: No focal deficit present.     Mental Status: She is alert and oriented to person, place, and time.  Psychiatric:        Mood and Affect: Mood normal.        Behavior: Behavior normal.        Thought Content: Thought content normal.       LABORATORY DATA:   I have reviewed the data as listed.  Results for orders placed or performed in visit on 03/28/24  Magnesium  Result Value Ref Range   Magnesium 1.9 1.7 - 2.4 mg/dL  CMP (Cancer Center only)  Result Value Ref Range   Sodium 140 135 - 145 mmol/L   Potassium 4.2 3.5 - 5.1 mmol/L   Chloride 108 98 - 111 mmol/L   CO2 28 22 - 32 mmol/L   Glucose, Bld 83 70 - 99 mg/dL   BUN 15 6 - 20 mg/dL   Creatinine 1.61 0.96 - 1.00 mg/dL   Calcium 8.6 (L) 8.9 - 10.3 mg/dL   Total Protein 7.2 6.5 - 8.1 g/dL   Albumin 4.4 3.5 - 5.0 g/dL   AST 15 15 - 41 U/L   ALT 10 0 - 44 U/L   Alkaline Phosphatase 73 38 - 126 U/L   Total Bilirubin 0.4 0.0 - 1.2 mg/dL   GFR, Estimated >04 >54 mL/min   Anion gap 4 (L) 5 - 15  CBC with Differential (Cancer Center Only)  Result Value Ref Range   WBC Count 4.0 4.0 - 10.5 K/uL   RBC 4.37 3.87 - 5.11 MIL/uL   Hemoglobin 12.3 12.0 - 15.0 g/dL   HCT 09.8 11.9 - 14.7 %   MCV 83.5 80.0 - 100.0 fL   MCH 28.1 26.0 - 34.0 pg    MCHC 33.7 30.0 - 36.0 g/dL  RDW 13.0 11.5 - 15.5 %   Platelet Count 193 150 - 400 K/uL   nRBC 0.0 0.0 - 0.2 %   Neutrophils Relative % 49 %   Neutro Abs 2.0 1.7 - 7.7 K/uL   Lymphocytes Relative 38 %   Lymphs Abs 1.5 0.7 - 4.0 K/uL   Monocytes Relative 10 %   Monocytes Absolute 0.4 0.1 - 1.0 K/uL   Eosinophils Relative 2 %   Eosinophils Absolute 0.1 0.0 - 0.5 K/uL   Basophils Relative 1 %   Basophils Absolute 0.0 0.0 - 0.1 K/uL   Immature Granulocytes 0 %   Abs Immature Granulocytes 0.01 0.00 - 0.07 K/uL      RADIOGRAPHIC STUDIES:  I have reviewed CT chest abdomen pelvis from 01/10/2024 and whole-body I-131 scan from 01/04/2024.  CODE STATUS:   Orders Placed This Encounter  Procedures   CBC with Differential (Cancer Center Only)    Standing Status:   Standing    Number of Occurrences:   6    Expiration Date:   03/28/2025   CMP (Cancer Center only)    Standing Status:   Standing    Number of Occurrences:   6    Expiration Date:   03/28/2025   Magnesium    Standing Status:   Standing    Number of Occurrences:   6    Expiration Date:   03/28/2025   Phosphorus    Standing Status:   Standing    Number of Occurrences:   6    Expiration Date:   03/28/2025      This document was completed utilizing speech recognition software. Grammatical errors, random word insertions, pronoun errors, and incomplete sentences are an occasional consequence of this system due to software limitations, ambient noise, and hardware issues. Any formal questions or concerns about the content, text or information contained within the body of this dictation should be directly addressed to the provider for clarification.

## 2024-03-28 NOTE — Progress Notes (Signed)
 Patient here for Xgeva  injection.  Calcium 8.6.  Ok to proceed per Dr. Randye Buttner.  Dr. Randye Buttner wants patient to take double the calcium for this month.  Patient informed.

## 2024-04-01 ENCOUNTER — Ambulatory Visit: Payer: Self-pay | Admitting: Oncology

## 2024-04-02 ENCOUNTER — Telehealth: Payer: Self-pay | Admitting: Oncology

## 2024-04-02 NOTE — Telephone Encounter (Signed)
 Patient has been scheduled for follow-up visit per 03/28/24 LOS.  Pt aware of scheduled appt details.

## 2024-04-16 ENCOUNTER — Other Ambulatory Visit (HOSPITAL_COMMUNITY): Payer: Self-pay | Admitting: Internal Medicine

## 2024-04-16 DIAGNOSIS — C7951 Secondary malignant neoplasm of bone: Secondary | ICD-10-CM

## 2024-04-16 DIAGNOSIS — C73 Malignant neoplasm of thyroid gland: Secondary | ICD-10-CM

## 2024-04-25 ENCOUNTER — Inpatient Hospital Stay: Attending: Oncology

## 2024-04-25 ENCOUNTER — Encounter: Payer: Self-pay | Admitting: Oncology

## 2024-04-25 ENCOUNTER — Inpatient Hospital Stay: Admitting: Oncology

## 2024-04-25 VITALS — BP 124/72 | HR 74 | Temp 98.2°F | Resp 18 | Ht 67.0 in | Wt 171.4 lb

## 2024-04-25 DIAGNOSIS — C73 Malignant neoplasm of thyroid gland: Secondary | ICD-10-CM | POA: Diagnosis not present

## 2024-04-25 DIAGNOSIS — Z79899 Other long term (current) drug therapy: Secondary | ICD-10-CM | POA: Diagnosis not present

## 2024-04-25 DIAGNOSIS — Z85850 Personal history of malignant neoplasm of thyroid: Secondary | ICD-10-CM | POA: Diagnosis present

## 2024-04-25 DIAGNOSIS — R918 Other nonspecific abnormal finding of lung field: Secondary | ICD-10-CM | POA: Diagnosis not present

## 2024-04-25 DIAGNOSIS — C7951 Secondary malignant neoplasm of bone: Secondary | ICD-10-CM | POA: Insufficient documentation

## 2024-04-25 LAB — CMP (CANCER CENTER ONLY)
ALT: 9 U/L (ref 0–44)
AST: 13 U/L — ABNORMAL LOW (ref 15–41)
Albumin: 4.4 g/dL (ref 3.5–5.0)
Alkaline Phosphatase: 66 U/L (ref 38–126)
Anion gap: 5 (ref 5–15)
BUN: 14 mg/dL (ref 6–20)
CO2: 28 mmol/L (ref 22–32)
Calcium: 8.9 mg/dL (ref 8.9–10.3)
Chloride: 108 mmol/L (ref 98–111)
Creatinine: 0.81 mg/dL (ref 0.44–1.00)
GFR, Estimated: >60 mL/min (ref >60)
Glucose, Bld: 92 mg/dL (ref 70–99)
Potassium: 4.2 mmol/L (ref 3.5–5.1)
Sodium: 141 mmol/L (ref 135–145)
Total Bilirubin: 0.4 mg/dL (ref 0.0–1.2)
Total Protein: 7.2 g/dL (ref 6.5–8.1)

## 2024-04-25 LAB — CBC WITH DIFFERENTIAL (CANCER CENTER ONLY)
Abs Immature Granulocytes: 0 10*3/uL (ref 0.00–0.07)
Basophils Absolute: 0 10*3/uL (ref 0.0–0.1)
Basophils Relative: 1 %
Eosinophils Absolute: 0.1 10*3/uL (ref 0.0–0.5)
Eosinophils Relative: 2 %
HCT: 38.3 % (ref 36.0–46.0)
Hemoglobin: 13 g/dL (ref 12.0–15.0)
Immature Granulocytes: 0 %
Lymphocytes Relative: 38 %
Lymphs Abs: 1.6 10*3/uL (ref 0.7–4.0)
MCH: 28.8 pg (ref 26.0–34.0)
MCHC: 33.9 g/dL (ref 30.0–36.0)
MCV: 84.9 fL (ref 80.0–100.0)
Monocytes Absolute: 0.4 10*3/uL (ref 0.1–1.0)
Monocytes Relative: 10 %
Neutro Abs: 2.1 10*3/uL (ref 1.7–7.7)
Neutrophils Relative %: 49 %
Platelet Count: 189 10*3/uL (ref 150–400)
RBC: 4.51 MIL/uL (ref 3.87–5.11)
RDW: 12.6 % (ref 11.5–15.5)
WBC Count: 4.2 10*3/uL (ref 4.0–10.5)
nRBC: 0 % (ref 0.0–0.2)

## 2024-04-25 LAB — MAGNESIUM: Magnesium: 1.9 mg/dL (ref 1.7–2.4)

## 2024-04-25 LAB — PHOSPHORUS: Phosphorus: 3 mg/dL (ref 2.5–4.6)

## 2024-04-25 MED ORDER — DENOSUMAB 120 MG/1.7ML ~~LOC~~ SOLN
120.0000 mg | Freq: Once | SUBCUTANEOUS | Status: AC
  Administered 2024-04-25: 120 mg via SUBCUTANEOUS
  Filled 2024-04-25: qty 1.7

## 2024-04-25 NOTE — Progress Notes (Signed)
 [name]  ONCOLOGY CLINIC PROGRESS NOTE   Patient Care Team: Charle Congo, MD as PCP - General (Internal Medicine) Gordy Lauber, MD as Consulting Physician (Endocrinology)  PATIENT NAME: Tammy Nichols   MR#: [id number] DOB: 23-Jan-1971  Date of visit: 04/25/2024   ASSESSMENT & PLAN:   Tammy Nichols is a 53 y.o.  lady with a past medical history of papillary thyroid  cancer diagnosed in 2011 status post thyroidectomy followed by radioactive iodine therapy, remained on close surveillance under endocrinology department, was recently found to have bone metastatic disease on bone scan.  She was referred to our clinic in March 2025 for consideration of Xgeva  injections.  Her other comorbidities include seizure disorder.   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   She has been under regular monitoring with scans every six months. Recent scans have shown a slow-growing lung nodule in June 2024, which led to follow-up scans in January 2025. Lung nodules have minimally increased from 4mm to 5mm.   She later had a whole-body I-131 scan on 01/04/2024 which showed activity in the bones, particularly in the left pelvic area and left chest.  Lung nodules were not prominent on this scan.   Disease burden is low, but bone involvement is concerning.    Xgeva  (denosumab ) was proposed to control bone disease and prevent fractures. Risks include hypocalcemia and osteonecrosis of the jaw. Calcium and vitamin D  supplementation are necessary to mitigate hypocalcemia.     Treatment duration is indefinite, but a minimum of one year is planned, with regular scans to monitor disease progression.   Once dental clearance was obtained, she was started on Xgeva  from 02/01/2024, with plan to continue every 4 weeks.  Labs today reveal normal calcium  level, no dose-limiting toxicities.  Will proceed with Xgeva  dose today.  She was advised to continue oral calcium at home at 600 mg p.o. twice daily dosing.  We will recheck calcium and other labs on return visit.  - Continue regular scans at endocrinology office to monitor disease status.  RTC in 4 weeks for labs, and Xgeva  injections.  RTC in 8 weeks for labs, follow-up and Xgeva  injection.  Of note, apparently thyroglobulin levels were increased at her endocrinologist office recently and they are planning to treat her with radioactive iodine treatment starting in July with follow-up scan in August.    I reviewed lab results and outside records for this visit and discussed relevant results with the patient. Diagnosis, plan of care and treatment options were also discussed in detail with the patient. Opportunity provided to ask questions and answers provided to her apparent satisfaction. Provided instructions to call our clinic with any problems, questions or concerns prior to return visit. I recommended to continue follow-up with PCP and sub-specialists. She verbalized understanding and agreed with the plan.   NCCN guidelines have been consulted in the planning of this patient's care.  I spent a total of 30 minutes during this encounter with the patient including review of chart and various tests results, discussions about plan of care and coordination of care plan.   [name], MD  04/25/2024 3:28 PM  [facility] - A DEPT OF [facility] 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  INTERVAL HISTORY:    Discussed the use of AI scribe software for clinical note transcription with the patient, who gave verbal  consent to proceed.  History of Present Illness  She experiences significant pain centered in the hip area following a recent injection. The pain was severe enough last night to require two Tylenol , which did not alleviate her symptoms.  She feels unusually tired this week, attributing it to a lack of sleep, having only managed about three hours of sleep recently. Her recent blood work showed a previously low white blood cell count, which has since normalized.  She continues to take calcium supplements and high-dose vitamin D  once a week, a regimen she has been following for several years. She is unsure if the vitamin D  could be contributing to her symptoms.  She is under the care of an endocrinologist, visiting every six months for thyroid  level checks, with her next appointment scheduled for June.  No changes that could have caused the pain aside from the injection. Her blood work, including kidney and liver function tests, electrolytes, and platelet counts, are reported as normal.    Tammy Nichols is a 53 year old female who presents for follow-up on thyroglobulin levels. She was referred by Dr. Margaretmary Shaver for evaluation of increased thyroglobulin levels.  Her thyroglobulin levels have increased to 46. She is scheduled for another round of radioactive iodine treatment in July and a thyroid  scan in August. She last saw Dr. Margaretmary Shaver approximately one to two weeks ago.  She has been taking vitamin D  supplements and has recently increased her calcium intake to 1200 mg daily. Her calcium level is currently normal at 8.9, and her vitamin D  levels were previously checked and found to be normal.  She mentions using grounding sheets for the past three weeks, which she feels have helped alleviate her pain, reducing her need for Tylenol  or other pain medications. No recent pain has been reported.    I have reviewed the past medical history, past surgical history, social history and family history with the  patient and they are unchanged from previous note.  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  She underwent thyroidectomy followed by radioactive iodine therapy.    She has been under regular monitoring with scans every six months. Recent scans have shown a slow-growing lung nodule in June 2024, which led to follow-up scans in January 2025.  She later had a whole-body I-131 scan on 01/04/2024 which showed activity in the bones, particularly in the left pelvic area and left chest.  Lung nodules were not prominent on this scan.   She was referred to us  in March 2025 for consideration of Xgeva  injections for bone metastatic disease from papillary thyroid  cancer.  Disease burden is low, but bone involvement is concerning.    Xgeva  (denosumab ) was proposed to control bone disease and prevent fractures. Risks include hypocalcemia and osteonecrosis of the jaw. Calcium and vitamin D  supplementation are necessary to mitigate hypocalcemia.     Treatment duration is indefinite, but a minimum of one year is planned, with regular scans to monitor disease progression.   Once dental clearance was obtained, she was started on Xgeva  from 02/01/2024, with plan to continue every 4 weeks.  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  ALLERGIES: She is allergic to other,  soap, and amoxicillin.  MEDICATIONS:  Current Outpatient Medications  Medication Sig Dispense Refill   Calcium Carbonate-Vit D-Min (CALCIUM 1200 PO) Take 1 capsule by mouth daily.     lamoTRIgine (LAMICTAL) 100 MG tablet Take 100 mg by mouth daily.     levothyroxine (SYNTHROID, LEVOTHROID) 112 MCG tablet Take 224 mcg by mouth daily.     No current facility-administered medications for this visit.     VITALS:   Blood pressure 124/72, pulse 74, temperature 98.2 F (36.8 C), temperature source Temporal, resp. rate 18, height 5' 7 (1.702 m), weight 171 lb 6.4 oz (77.7 kg), SpO2 100%.  Wt Readings from Last 3 Encounters:  04/25/24 171 lb 6.4 oz (77.7 kg)  03/28/24 171 lb (77.6 kg)  02/29/24 168 lb (76.2 kg)    Body mass index is 26.85 kg/m.    Onc Performance Status - 04/25/24 1118       ECOG Perf Status   ECOG Perf Status Ambulatory and capable of all selfcare but unable to carry out any work activities.  Up and about more than 50% of waking hours      KPS SCALE   KPS % SCORE Normal activity with effort, some s/s of disease           PHYSICAL EXAM:   Physical Exam Constitutional:      General: She is not in acute distress.    Appearance: Normal appearance.  HENT:     Head: Normocephalic and atraumatic.   Eyes:     General: No scleral icterus.    Conjunctiva/sclera: Conjunctivae normal.    Cardiovascular:     Rate and Rhythm: Normal rate and regular rhythm.     Heart sounds: Normal heart sounds.  Pulmonary:     Effort: Pulmonary effort is normal.     Breath sounds: Normal breath sounds.  Abdominal:     General: There is no distension.   Musculoskeletal:     Right lower leg: No edema.     Left lower leg: No edema.   Neurological:     General: No focal deficit present.     Mental Status: She is alert and oriented to person, place, and time.   Psychiatric:        Mood and Affect: Mood normal.        Behavior: Behavior normal.        Thought Content:  Thought content normal.       LABORATORY DATA:   I have reviewed the data as listed.  Results for orders placed or performed in visit on 04/25/24  Phosphorus  Result Value Ref Range   Phosphorus 3.0 2.5 - 4.6 mg/dL  Magnesium  Result Value Ref Range   Magnesium 1.9 1.7 - 2.4 mg/dL  CMP (Cancer Center only)  Result Value Ref Range   Sodium 141 135 - 145 mmol/L   Potassium 4.2 3.5 -  5.1 mmol/L   Chloride 108 98 - 111 mmol/L   CO2 28 22 - 32 mmol/L   Glucose, Bld 92 70 - 99 mg/dL   BUN 14 6 - 20 mg/dL   Creatinine 1.61 0.96 - 1.00 mg/dL   Calcium 8.9 8.9 - 04.5 mg/dL   Total Protein 7.2 6.5 - 8.1 g/dL   Albumin 4.4 3.5 - 5.0 g/dL   AST 13 (L) 15 - 41 U/L   ALT 9 0 - 44 U/L   Alkaline Phosphatase 66 38 - 126 U/L   Total Bilirubin 0.4 0.0 - 1.2 mg/dL   GFR, Estimated >40 >98 mL/min   Anion gap 5 5 - 15  CBC with Differential (Cancer Center Only)  Result Value Ref Range   WBC Count 4.2 4.0 - 10.5 K/uL   RBC 4.51 3.87 - 5.11 MIL/uL   Hemoglobin 13.0 12.0 - 15.0 g/dL   HCT 11.9 14.7 - 82.9 %   MCV 84.9 80.0 - 100.0 fL   MCH 28.8 26.0 - 34.0 pg   MCHC 33.9 30.0 - 36.0 g/dL   RDW 56.2 13.0 - 86.5 %   Platelet Count 189 150 - 400 K/uL   nRBC 0.0 0.0 - 0.2 %   Neutrophils Relative % 49 %   Neutro Abs 2.1 1.7 - 7.7 K/uL   Lymphocytes Relative 38 %   Lymphs Abs 1.6 0.7 - 4.0 K/uL   Monocytes Relative 10 %   Monocytes Absolute 0.4 0.1 - 1.0 K/uL   Eosinophils Relative 2 %   Eosinophils Absolute 0.1 0.0 - 0.5 K/uL   Basophils Relative 1 %   Basophils Absolute 0.0 0.0 - 0.1 K/uL   Immature Granulocytes 0 %   Abs Immature Granulocytes 0.00 0.00 - 0.07 K/uL      RADIOGRAPHIC STUDIES:  I have reviewed CT chest abdomen pelvis from 01/10/2024 and whole-body I-131 scan from 01/04/2024.  CODE STATUS:   No orders of the defined types were placed in this encounter.     This document was completed utilizing speech recognition software. Grammatical errors, random word  insertions, pronoun errors, and incomplete sentences are an occasional consequence of this system due to software limitations, ambient noise, and hardware issues. Any formal questions or concerns about the content, text or information contained within the body of this dictation should be directly addressed to the provider for clarification.

## 2024-04-25 NOTE — Written Directive (Addendum)
 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

## 2024-04-25 NOTE — Patient Instructions (Signed)
 Denosumab Injection (Oncology) What is this medication? DENOSUMAB (den oh SUE mab) prevents weakened bones caused by cancer. It may also be used to treat noncancerous bone tumors that cannot be removed by surgery. It can also be used to treat high calcium levels in the blood caused by cancer. It works by blocking a protein that causes bones to break down quickly. This slows down the release of calcium from bones, which lowers calcium levels in your blood. It also makes your bones stronger and less likely to break (fracture). This medicine may be used for other purposes; ask your health care provider or pharmacist if you have questions. COMMON BRAND NAME(S): XGEVA What should I tell my care team before I take this medication? They need to know if you have any of these conditions: Dental disease Having surgery or tooth extraction Infection Kidney disease Low levels of calcium or vitamin D in the blood Malnutrition On hemodialysis Skin conditions or sensitivity Thyroid or parathyroid disease An unusual reaction to denosumab, other medications, foods, dyes, or preservatives Pregnant or trying to get pregnant Breast-feeding How should I use this medication? This medication is for injection under the skin. It is given by your care team in a hospital or clinic setting. A special MedGuide will be given to you before each treatment. Be sure to read this information carefully each time. Talk to your care team about the use of this medication in children. While it may be prescribed for children as young as 13 years for selected conditions, precautions do apply. Overdosage: If you think you have taken too much of this medicine contact a poison control center or emergency room at once. NOTE: This medicine is only for you. Do not share this medicine with others. What if I miss a dose? Keep appointments for follow-up doses. It is important not to miss your dose. Call your care team if you are unable to  keep an appointment. What may interact with this medication? Do not take this medication with any of the following: Other medications containing denosumab This medication may also interact with the following: Medications that lower your chance of fighting infection Steroid medications, such as prednisone or cortisone This list may not describe all possible interactions. Give your health care provider a list of all the medicines, herbs, non-prescription drugs, or dietary supplements you use. Also tell them if you smoke, drink alcohol, or use illegal drugs. Some items may interact with your medicine. What should I watch for while using this medication? Your condition will be monitored carefully while you are receiving this medication. You may need blood work while taking this medication. This medication may increase your risk of getting an infection. Call your care team for advice if you get a fever, chills, sore throat, or other symptoms of a cold or flu. Do not treat yourself. Try to avoid being around people who are sick. You should make sure you get enough calcium and vitamin D while you are taking this medication, unless your care team tells you not to. Discuss the foods you eat and the vitamins you take with your care team. Some people who take this medication have severe bone, joint, or muscle pain. This medication may also increase your risk for jaw problems or a broken thigh bone. Tell your care team right away if you have severe pain in your jaw, bones, joints, or muscles. Tell your care team if you have any pain that does not go away or that gets worse. Talk  to your care team if you may be pregnant. Serious birth defects can occur if you take this medication during pregnancy and for 5 months after the last dose. You will need a negative pregnancy test before starting this medication. Contraception is recommended while taking this medication and for 5 months after the last dose. Your care team  can help you find the option that works for you. What side effects may I notice from receiving this medication? Side effects that you should report to your care team as soon as possible: Allergic reactions--skin rash, itching, hives, swelling of the face, lips, tongue, or throat Bone, joint, or muscle pain Low calcium level--muscle pain or cramps, confusion, tingling, or numbness in the hands or feet Osteonecrosis of the jaw--pain, swelling, or redness in the mouth, numbness of the jaw, poor healing after dental work, unusual discharge from the mouth, visible bones in the mouth Side effects that usually do not require medical attention (report to your care team if they continue or are bothersome): Cough Diarrhea Fatigue Headache Nausea This list may not describe all possible side effects. Call your doctor for medical advice about side effects. You may report side effects to FDA at 1-800-FDA-1088. Where should I keep my medication? This medication is given in a hospital or clinic. It will not be stored at home. NOTE: This sheet is a summary. It may not cover all possible information. If you have questions about this medicine, talk to your doctor, pharmacist, or health care provider.  2024 Elsevier/Gold Standard (2022-03-15 00:00:00)

## 2024-04-25 NOTE — Assessment & Plan Note (Signed)
 Papillary thyroid  cancer, initially diagnosed in 2011, was treated with total thyroidectomy and radioactive iodine therapy.   She has been under regular monitoring with scans every six months. Recent scans have shown a slow-growing lung nodule in June 2024, which led to follow-up scans in January 2025. Lung nodules have minimally increased from 4mm to 5mm.   She later had a whole-body I-131 scan on 01/04/2024 which showed activity in the bones, particularly in the left pelvic area and left chest.  Lung nodules were not prominent on this scan.   Disease burden is low, but bone involvement is concerning.    Xgeva  (denosumab ) was proposed to control bone disease and prevent fractures. Risks include hypocalcemia and osteonecrosis of the jaw. Calcium and vitamin D  supplementation are necessary to mitigate hypocalcemia.     Treatment duration is indefinite, but a minimum of one year is planned, with regular scans to monitor disease progression.   Once dental clearance was obtained, she was started on Xgeva  from 02/01/2024, with plan to continue every 4 weeks.  Labs today reveal normal calcium level, no dose-limiting toxicities.  Will proceed with Xgeva  dose today.  She was advised to continue oral calcium at home at 600 mg p.o. twice daily dosing.  We will recheck calcium and other labs on return visit.  - Continue regular scans at endocrinology office to monitor disease status.  RTC in 4 weeks for labs, and Xgeva  injections.  RTC in 8 weeks for labs, follow-up and Xgeva  injection.  Of note, apparently thyroglobulin levels were increased at her endocrinologist office recently and they are planning to treat her with radioactive iodine treatment starting in July with follow-up scan in August.

## 2024-04-28 ENCOUNTER — Encounter (HOSPITAL_COMMUNITY): Payer: Self-pay

## 2024-05-08 ENCOUNTER — Telehealth: Payer: Self-pay

## 2024-05-08 ENCOUNTER — Encounter (HOSPITAL_COMMUNITY): Payer: Self-pay | Admitting: *Deleted

## 2024-05-08 ENCOUNTER — Other Ambulatory Visit: Payer: Self-pay

## 2024-05-08 ENCOUNTER — Ambulatory Visit (HOSPITAL_COMMUNITY)
Admission: EM | Admit: 2024-05-08 | Discharge: 2024-05-08 | Disposition: A | Discharge status: Home / Self Care | Attending: Family Medicine | Admitting: Family Medicine

## 2024-05-08 DIAGNOSIS — R220 Localized swelling, mass and lump, head: Secondary | ICD-10-CM

## 2024-05-08 NOTE — ED Triage Notes (Signed)
 Pt states she went to the dentist this morning for a cleaning. She states 2 hours after that appt she developed left jaw pain and swelling. She called her oncologist they advised to come to UC  She states pain has resolved but swelling still there.

## 2024-05-08 NOTE — Progress Notes (Signed)
 Opened in error

## 2024-05-08 NOTE — ED Provider Notes (Signed)
 [facility]   [id number] 05/08/24 Arrival Time: 1322  ASSESSMENT & PLAN:  1. Facial swelling    Likely related to stretching of jaw at dental cleaning this morning. Doubt medication related given abrupt onset with sharp pain and mild swelling. Denies any specific tooth pain.  OTC symptom care as needed.   Follow-up Information     Shelda Atlas, MD.   Specialty: Internal Medicine Why: As needed. Contact information: [address] [place] [address] [phone]         [facility] Department at [facility].   Specialty: Emergency Medicine Why: If symptoms worsen in any way. Contact information: [address] [place]  [address] [phone]                Reviewed expectations re: course of current medical issues. Questions answered. Outlined signs and symptoms indicating need for more acute intervention. Understanding verbalized. After Visit Summary given.   SUBJECTIVE: History from: Patient. Tammy Nichols is a 53 y.o. female. Pt states she went to the dentist this morning for a cleaning. She states 2 hours after that appt she developed left jaw pain and swelling. She called her oncologist they advised to come to UC  She states pain has resolved but swelling still there.  Denies: fever. Normal PO intake without n/v/d.  OBJECTIVE:  Vitals:   05/08/24 1443  BP: (!) 144/88  Pulse: 73  Resp: 18  Temp: 98.4 F (36.9 C)  TempSrc: Oral  SpO2: 96%    General appearance: alert; no distress Eyes: PERRLA; EOMI; conjunctiva normal HENT: [place]; AT; without nasal congestion; is TTP around LEFT TMJ distribution; normal jaw movement Neck: supple without LAD Lungs: speaks full sentences without difficulty; unlabored Extremities: no edema Skin: warm and dry Psychological: alert and cooperative; normal mood and affect  Allergies  Allergen Reactions   Other Hives, Shortness Of Breath, Swelling,  Anaphylaxis and Other (See Comments)    Tree nut   Soap Other (See Comments)    Dial soap burned my skin   Amoxicillin Rash    Past Medical History:  Diagnosis Date   Acquired hypothyroidism    Iron deficiency    Seizure disorder (HCC)    Thyroid  cancer (HCC)    Social History   Socioeconomic History   Marital status: Widowed    Spouse name: Not on file   Number of children: Not on file   Years of education: Not on file   Highest education level: Not on file  Occupational History   Not on file  Tobacco Use   Smoking status: Never   Smokeless tobacco: Never  Vaping Use   Vaping status: Never Used  Substance and Sexual Activity   Alcohol use: Yes    Comment: social   Drug use: No   Sexual activity: Not Currently  Other Topics Concern   Not on file  Social History Narrative   Not on file   Social Drivers of Health   Financial Resource Strain: Not on file  Food Insecurity: Not on file  Transportation Needs: Not on file  Physical Activity: Not on file  Stress: Not on file  Social Connections: Unknown (05/21/2023)   Received from [facility]   Social Network    Social Network: Not on file  Intimate Partner Violence: Unknown (05/21/2023)   Received from [facility]   HITS    Physically Hurt: Not on file    Insult or Talk Down To: Not on  file    Threaten Physical Harm: Not on file    Scream or Curse: Not on file   Family History  Problem Relation Age of Onset   Hypertension Mother    Hypertension Father    Past Surgical History:  Procedure Laterality Date   BREAST EXCISIONAL BIOPSY Right    BREAST LUMPECTOMY WITH RADIOACTIVE SEED LOCALIZATION Right 03/25/2020   Procedure: RIGHT BREAST LUMPECTOMY WITH RADIOACTIVE SEED LOCALIZATION;  Surgeon: Vernetta Berg, MD;  Location: [facility];  Service: General;  Laterality: Right;  LMA   CLEMENTINA Rolinda Rogue, MD 05/08/24 1625

## 2024-05-08 NOTE — Telephone Encounter (Signed)
 Patient currently at Urgent Care as advised by Dr. Autumn due to swelling to left ear area per patient report. Pt had undergone dental cleaning approximately 2-3 hrs before this symptom began. No other symptoms present.

## 2024-05-23 ENCOUNTER — Inpatient Hospital Stay

## 2024-05-23 ENCOUNTER — Inpatient Hospital Stay: Attending: Oncology

## 2024-05-23 ENCOUNTER — Ambulatory Visit: Admitting: Oncology

## 2024-05-23 VITALS — BP 139/93 | HR 72 | Resp 17

## 2024-05-23 DIAGNOSIS — Z85850 Personal history of malignant neoplasm of thyroid: Secondary | ICD-10-CM | POA: Diagnosis present

## 2024-05-23 DIAGNOSIS — C7951 Secondary malignant neoplasm of bone: Secondary | ICD-10-CM

## 2024-05-23 LAB — CBC WITH DIFFERENTIAL (CANCER CENTER ONLY)
Abs Immature Granulocytes: 0.01 K/uL (ref 0.00–0.07)
Basophils Absolute: 0 K/uL (ref 0.0–0.1)
Basophils Relative: 1 %
Eosinophils Absolute: 0.1 K/uL (ref 0.0–0.5)
Eosinophils Relative: 2 %
HCT: 36.5 % (ref 36.0–46.0)
Hemoglobin: 12.8 g/dL (ref 12.0–15.0)
Immature Granulocytes: 0 %
Lymphocytes Relative: 39 %
Lymphs Abs: 1.6 K/uL (ref 0.7–4.0)
MCH: 28.8 pg (ref 26.0–34.0)
MCHC: 35.1 g/dL (ref 30.0–36.0)
MCV: 82 fL (ref 80.0–100.0)
Monocytes Absolute: 0.3 K/uL (ref 0.1–1.0)
Monocytes Relative: 9 %
Neutro Abs: 2 K/uL (ref 1.7–7.7)
Neutrophils Relative %: 49 %
Platelet Count: 186 K/uL (ref 150–400)
RBC: 4.45 MIL/uL (ref 3.87–5.11)
RDW: 12.1 % (ref 11.5–15.5)
WBC Count: 4 K/uL (ref 4.0–10.5)
nRBC: 0 % (ref 0.0–0.2)

## 2024-05-23 LAB — CMP (CANCER CENTER ONLY)
ALT: 9 U/L (ref 0–44)
AST: 13 U/L — ABNORMAL LOW (ref 15–41)
Albumin: 4 g/dL (ref 3.5–5.0)
Alkaline Phosphatase: 59 U/L (ref 38–126)
Anion gap: 5 (ref 5–15)
BUN: 15 mg/dL (ref 6–20)
CO2: 25 mmol/L (ref 22–32)
Calcium: 8.4 mg/dL — ABNORMAL LOW (ref 8.9–10.3)
Chloride: 110 mmol/L (ref 98–111)
Creatinine: 0.8 mg/dL (ref 0.44–1.00)
GFR, Estimated: >60 mL/min (ref >60)
Glucose, Bld: 86 mg/dL (ref 70–99)
Potassium: 4.1 mmol/L (ref 3.5–5.1)
Sodium: 140 mmol/L (ref 135–145)
Total Bilirubin: 0.3 mg/dL (ref 0.0–1.2)
Total Protein: 6.8 g/dL (ref 6.5–8.1)

## 2024-05-23 LAB — MAGNESIUM: Magnesium: 1.8 mg/dL (ref 1.7–2.4)

## 2024-05-23 LAB — PHOSPHORUS: Phosphorus: 3.4 mg/dL (ref 2.5–4.6)

## 2024-05-23 MED ORDER — DENOSUMAB 120 MG/1.7ML ~~LOC~~ SOLN
120.0000 mg | Freq: Once | SUBCUTANEOUS | Status: AC
  Administered 2024-05-23: 120 mg via SUBCUTANEOUS
  Filled 2024-05-23: qty 1.7

## 2024-05-23 NOTE — Written Directive (Addendum)
  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     Yes []   No []     Lymphovascular Invasion?  Yes  []   No  []     Margins positive ? Yes []   No []     Lymph nodes positive? Yes []   No  []       # positive nodes:   # negative nodes:     TNM staging: pT:         PN:         Mx:    ADDITIONAL PHYSICIAN COMMENTS/NOTES Retreatment for PTC... 134 mCi on 12/2023.  F/u scan lung mets and bone met. Rising thyroglobulin to 46.  Prior remnant abalation w/ 100 mCi in 2011. Discussed with Dr. Alexander.  AUTHORIZED USER SIGNATURE & TIME STAMP: Norleen GORMAN Boxer, MD   05/27/24    12:03 PM

## 2024-05-23 NOTE — Progress Notes (Signed)
 OK to treat with Xgeva  and today's calcium level of 8.4  T.O. Powell Lessen, NP/Noelie Renfrow Joshua, PharmD

## 2024-05-26 ENCOUNTER — Encounter (HOSPITAL_COMMUNITY)
Admission: RE | Admit: 2024-05-26 | Discharge: 2024-05-26 | Disposition: A | Discharge status: Home / Self Care | Source: Ambulatory Visit | Attending: Internal Medicine | Admitting: Internal Medicine

## 2024-05-27 ENCOUNTER — Ambulatory Visit (HOSPITAL_COMMUNITY)

## 2024-05-28 ENCOUNTER — Ambulatory Visit (HOSPITAL_COMMUNITY)

## 2024-05-30 ENCOUNTER — Other Ambulatory Visit (HOSPITAL_COMMUNITY)

## 2024-06-02 ENCOUNTER — Encounter (HOSPITAL_COMMUNITY): Payer: Self-pay

## 2024-06-02 ENCOUNTER — Encounter (HOSPITAL_COMMUNITY)
Admission: RE | Admit: 2024-06-02 | Discharge: 2024-06-02 | Disposition: A | Discharge status: Home / Self Care | Source: Ambulatory Visit | Attending: Internal Medicine | Admitting: Internal Medicine

## 2024-06-02 ENCOUNTER — Encounter (HOSPITAL_COMMUNITY): Admission: RE | Admit: 2024-06-02 | Source: Ambulatory Visit

## 2024-06-02 DIAGNOSIS — C7951 Secondary malignant neoplasm of bone: Secondary | ICD-10-CM | POA: Diagnosis present

## 2024-06-02 DIAGNOSIS — C73 Malignant neoplasm of thyroid gland: Secondary | ICD-10-CM | POA: Diagnosis present

## 2024-06-02 MED ORDER — THYROTROPIN ALFA 0.9 MG IM SOLR
INTRAMUSCULAR | Status: AC
  Filled 2024-06-02: qty 0.9

## 2024-06-02 MED ORDER — THYROTROPIN ALFA 0.9 MG IM SOLR
0.9000 mg | INTRAMUSCULAR | Status: AC
  Administered 2024-06-02: 0.9 mg via INTRAMUSCULAR

## 2024-06-03 ENCOUNTER — Encounter (HOSPITAL_COMMUNITY)
Admission: RE | Admit: 2024-06-03 | Discharge: 2024-06-03 | Disposition: A | Discharge status: Home / Self Care | Source: Ambulatory Visit | Attending: Internal Medicine | Admitting: Internal Medicine

## 2024-06-03 ENCOUNTER — Ambulatory Visit (HOSPITAL_COMMUNITY)

## 2024-06-03 DIAGNOSIS — C73 Malignant neoplasm of thyroid gland: Secondary | ICD-10-CM | POA: Diagnosis not present

## 2024-06-03 MED ORDER — THYROTROPIN ALFA 0.9 MG IM SOLR
0.9000 mg | INTRAMUSCULAR | Status: AC
  Administered 2024-06-03: 0.9 mg via INTRAMUSCULAR

## 2024-06-03 MED ORDER — THYROTROPIN ALFA 0.9 MG IM SOLR
INTRAMUSCULAR | Status: AC
Start: 2024-06-03 — End: 2024-06-03
  Filled 2024-06-03: qty 0.9

## 2024-06-04 ENCOUNTER — Encounter (HOSPITAL_COMMUNITY)
Admission: RE | Admit: 2024-06-04 | Discharge: 2024-06-04 | Disposition: A | Discharge status: Home / Self Care | Source: Ambulatory Visit | Attending: Internal Medicine | Admitting: Internal Medicine

## 2024-06-04 ENCOUNTER — Ambulatory Visit (HOSPITAL_COMMUNITY)

## 2024-06-04 DIAGNOSIS — C73 Malignant neoplasm of thyroid gland: Secondary | ICD-10-CM | POA: Diagnosis not present

## 2024-06-04 MED ORDER — SODIUM IODIDE I 131 CAPSULE
204.0000 | Freq: Once | INTRAVENOUS | Status: AC | PRN
  Administered 2024-06-04: 204 via ORAL

## 2024-06-09 ENCOUNTER — Telehealth: Payer: Self-pay | Admitting: Oncology

## 2024-06-09 NOTE — Telephone Encounter (Signed)
 Patient is aware of her scheduled appointments on 8/15 per the request made from Lacie's secure chat.

## 2024-06-13 ENCOUNTER — Encounter (HOSPITAL_COMMUNITY)
Admission: RE | Admit: 2024-06-13 | Discharge: 2024-06-13 | Disposition: A | Discharge status: Home / Self Care | Source: Ambulatory Visit | Attending: Internal Medicine | Admitting: Internal Medicine

## 2024-06-13 DIAGNOSIS — C73 Malignant neoplasm of thyroid gland: Secondary | ICD-10-CM | POA: Diagnosis not present

## 2024-06-13 DIAGNOSIS — C7951 Secondary malignant neoplasm of bone: Secondary | ICD-10-CM

## 2024-06-20 ENCOUNTER — Inpatient Hospital Stay: Attending: Oncology

## 2024-06-20 ENCOUNTER — Inpatient Hospital Stay (HOSPITAL_BASED_OUTPATIENT_CLINIC_OR_DEPARTMENT_OTHER): Admitting: Oncology

## 2024-06-20 ENCOUNTER — Encounter: Payer: Self-pay | Admitting: Oncology

## 2024-06-20 ENCOUNTER — Inpatient Hospital Stay

## 2024-06-20 VITALS — BP 136/87 | HR 66 | Temp 98.3°F | Resp 16

## 2024-06-20 VITALS — BP 137/99 | HR 85 | Temp 97.6°F | Resp 14 | Wt 177.6 lb

## 2024-06-20 DIAGNOSIS — Z85850 Personal history of malignant neoplasm of thyroid: Secondary | ICD-10-CM | POA: Diagnosis present

## 2024-06-20 DIAGNOSIS — E890 Postprocedural hypothyroidism: Secondary | ICD-10-CM | POA: Diagnosis not present

## 2024-06-20 DIAGNOSIS — R918 Other nonspecific abnormal finding of lung field: Secondary | ICD-10-CM | POA: Diagnosis not present

## 2024-06-20 DIAGNOSIS — K5909 Other constipation: Secondary | ICD-10-CM | POA: Diagnosis not present

## 2024-06-20 DIAGNOSIS — Z79890 Hormone replacement therapy (postmenopausal): Secondary | ICD-10-CM | POA: Diagnosis not present

## 2024-06-20 DIAGNOSIS — C73 Malignant neoplasm of thyroid gland: Secondary | ICD-10-CM

## 2024-06-20 DIAGNOSIS — Z79899 Other long term (current) drug therapy: Secondary | ICD-10-CM | POA: Diagnosis not present

## 2024-06-20 DIAGNOSIS — D72819 Decreased white blood cell count, unspecified: Secondary | ICD-10-CM | POA: Diagnosis not present

## 2024-06-20 DIAGNOSIS — C7951 Secondary malignant neoplasm of bone: Secondary | ICD-10-CM | POA: Diagnosis not present

## 2024-06-20 DIAGNOSIS — G40909 Epilepsy, unspecified, not intractable, without status epilepticus: Secondary | ICD-10-CM | POA: Insufficient documentation

## 2024-06-20 DIAGNOSIS — Z880 Allergy status to penicillin: Secondary | ICD-10-CM | POA: Diagnosis not present

## 2024-06-20 LAB — CMP (CANCER CENTER ONLY)
ALT: 8 U/L (ref 0–44)
AST: 13 U/L — ABNORMAL LOW (ref 15–41)
Albumin: 4.4 g/dL (ref 3.5–5.0)
Alkaline Phosphatase: 52 U/L (ref 38–126)
Anion gap: 5 (ref 5–15)
BUN: 12 mg/dL (ref 6–20)
CO2: 27 mmol/L (ref 22–32)
Calcium: 8.5 mg/dL — ABNORMAL LOW (ref 8.9–10.3)
Chloride: 108 mmol/L (ref 98–111)
Creatinine: 0.86 mg/dL (ref 0.44–1.00)
GFR, Estimated: >60 mL/min (ref >60)
Glucose, Bld: 83 mg/dL (ref 70–99)
Potassium: 4.1 mmol/L (ref 3.5–5.1)
Sodium: 140 mmol/L (ref 135–145)
Total Bilirubin: 0.4 mg/dL (ref 0.0–1.2)
Total Protein: 6.9 g/dL (ref 6.5–8.1)

## 2024-06-20 LAB — CBC WITH DIFFERENTIAL (CANCER CENTER ONLY)
Abs Immature Granulocytes: 0.01 K/uL (ref 0.00–0.07)
Basophils Absolute: 0 K/uL (ref 0.0–0.1)
Basophils Relative: 1 %
Eosinophils Absolute: 0.1 K/uL (ref 0.0–0.5)
Eosinophils Relative: 3 %
HCT: 36.8 % (ref 36.0–46.0)
Hemoglobin: 12.6 g/dL (ref 12.0–15.0)
Immature Granulocytes: 0 %
Lymphocytes Relative: 28 %
Lymphs Abs: 1 K/uL (ref 0.7–4.0)
MCH: 28.5 pg (ref 26.0–34.0)
MCHC: 34.2 g/dL (ref 30.0–36.0)
MCV: 83.3 fL (ref 80.0–100.0)
Monocytes Absolute: 0.4 K/uL (ref 0.1–1.0)
Monocytes Relative: 11 %
Neutro Abs: 2 K/uL (ref 1.7–7.7)
Neutrophils Relative %: 57 %
Platelet Count: 190 K/uL (ref 150–400)
RBC: 4.42 MIL/uL (ref 3.87–5.11)
RDW: 12.6 % (ref 11.5–15.5)
WBC Count: 3.5 K/uL — ABNORMAL LOW (ref 4.0–10.5)
nRBC: 0 % (ref 0.0–0.2)

## 2024-06-20 LAB — MAGNESIUM: Magnesium: 2 mg/dL (ref 1.7–2.4)

## 2024-06-20 LAB — PHOSPHORUS: Phosphorus: 2.7 mg/dL (ref 2.5–4.6)

## 2024-06-20 MED ORDER — DENOSUMAB 120 MG/1.7ML ~~LOC~~ SOLN
120.0000 mg | Freq: Once | SUBCUTANEOUS | Status: AC
  Administered 2024-06-20: 120 mg via SUBCUTANEOUS
  Filled 2024-06-20: qty 1.7

## 2024-06-20 NOTE — Assessment & Plan Note (Addendum)
 Papillary thyroid  cancer, initially diagnosed in 2011, was treated with total thyroidectomy and radioactive iodine therapy.   She has been under regular monitoring with scans every six months. Recent scans have shown a slow-growing lung nodule in June 2024, which led to follow-up scans in January 2025. Lung nodules have minimally increased from 4mm to 5mm.   She later had a whole-body I-131 scan on 01/04/2024 which showed activity in the bones, particularly in the left pelvic area and left chest.  Lung nodules were not prominent on this scan.   Disease burden is low, but bone involvement is concerning.    Xgeva  (denosumab ) was proposed to control bone disease and prevent fractures. Risks include hypocalcemia and osteonecrosis of the jaw. Calcium and vitamin D  supplementation are necessary to mitigate hypocalcemia.     Treatment duration is indefinite, but a minimum of one year is planned, with regular scans to monitor disease progression.   Once dental clearance was obtained, she was started on Xgeva  from 02/01/2024, with plan to continue every 4 weeks.  Labs today reveal no dose-limiting toxicities.  Will proceed with Xgeva  dose today.  She was advised to continue oral calcium at home at 600 mg p.o. twice daily dosing.  We will recheck calcium and other labs on return visit.  Patient recently established with [facility] endocrinology, Dr. Ingrid.  Apparently they are planning to get PET scan for further evaluation of bone lesions and possibly biopsy of the lesions.  RTC in 4 weeks for labs, and Xgeva  injections.  RTC in 8 weeks for labs, follow-up and Xgeva  injection.

## 2024-06-20 NOTE — Progress Notes (Signed)
 Patient here for Xgeva  injection.  Calcium 8.5.  Secure chatted Dr. Autumn and Eileen/RN.  Ok to proceed with injection

## 2024-06-20 NOTE — Progress Notes (Signed)
 [facility]  ONCOLOGY CLINIC PROGRESS NOTE   Patient Care Team: Shelda Atlas, MD as PCP - General (Internal Medicine) Faythe Purchase, MD as Consulting Physician (Endocrinology)  PATIENT NAME: Tammy Nichols   MR#: [id number] DOB: [birth year]  Date of visit: 06/20/2024   ASSESSMENT & PLAN:   Tammy Nichols is a 53 y.o.  lady with a past medical history of papillary thyroid cancer diagnosed in 2011 status post thyroidectomy followed by radioactive iodine therapy, remained on close surveillance under endocrinology department, was recently found to have bone metastatic disease on bone scan.  She was referred to our clinic in March 2025 for consideration of Xgeva injections.  Her other comorbidities include seizure disorder.   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   She has been under regular monitoring with scans every six months. Recent scans have shown a slow-growing lung nodule in June 2024, which led to follow-up scans in January 2025. Lung nodules have minimally increased from 4mm to 5mm.   She later had a whole-body I-131 scan on 01/04/2024 which showed activity in the bones, particularly in the left pelvic area and left chest.  Lung nodules were not prominent on this scan.   Disease burden is low, but bone involvement is concerning.    Xgeva (denosumab) was proposed to control bone disease and prevent fractures. Risks include hypocalcemia and osteonecrosis of the jaw. Calcium and vitamin D supplementation are necessary to mitigate hypocalcemia.     Treatment duration is indefinite, but a minimum of one year is planned, with regular scans to monitor disease progression.   Once dental clearance was obtained, she was started on Xgeva from 02/01/2024, with plan to continue every 4 weeks.  Labs today reveal no dose-limiting  toxicities.  Will proceed with Xgeva dose today.  She was advised to continue oral calcium at home at 600 mg p.o. twice daily dosing.  We will recheck calcium and other labs on return visit.  Patient recently established with [facility] endocr[facility]ology, Dr. Ingrid.  Apparently they are planning to get PET scan for further evaluation of bone lesions and possibly biopsy of the lesions.  RTC in 4 weeks for labs, and Xgeva injections.  RTC in 8 weeks for labs, follow-up and Xgeva injection.  Assessment and Plan Assessment & Plan Metastatic thyroid cancer The recent scan from June 13, 2024, shows no progression of disease but persistent activity in the left chest wall and left pelvis, similar to previous scans. The endocrinologist suggests that the initial radioactive treatment was ineffective. The current treatment plan is supported due to the lack of significant change in the past six months. - Continue current injections as per endocrinologist's guidance - Coordinate with endocrinologist for PET scan and bone biopsy - Schedule radiation therapy post-biopsy if indicated  Leukopenia White blood cell count is slightly low at 3,500, consistent with previous trends. Her baseline is typically on the lower side, with occasional dips below normal. No concerning immature cells are present in the blood work. - Monitor white blood cell count in follow-up labs  Chronic constipation Reports constipation as a long-standing issue. She does not consume much dietary fiber, which may contribute to the problem. No other gastrointestinal symptoms like nausea, vomiting, or diarrhea are present. - Recommend starting fiber supplements such as Benefiber or fiber gummies - Advise on increasing hydration - Suggest using Miralax as needed  if fiber supplements and hydration are insufficient   I reviewed lab results and outside records for this visit and discussed relevant results with the patient. Diagnosis, plan of care and  treatment options were also discussed in detail with the patient. Opportunity provided to ask questions and answers provided to her apparent satisfaction. Provided instructions to call our clinic with any problems, questions or concerns prior to return visit. I recommended to continue follow-up with PCP and sub-specialists. She verbalized understanding and agreed with the plan.   NCCN guidelines have been consulted in the planning of this patient's care.  I spent a total of 30 minutes during this encounter with the patient including review of chart and various tests results, discussions about plan of care and coordination of care plan.   Chinita Patten, MD  06/20/2024 1:33 PM  [facility] - [name]  Current Treatment: Started Xgeva  for bone metastatic disease from March 2025.  INTERVAL HISTORY:    Discussed the use of AI scribe software for clinical note transcription with the patient, who gave verbal consent to proceed.  History of Present Illness  Tammy Nichols is a 53 year old female with thyroid  cancer who presents for follow-up after a recent scan. She recently transitioned her endocrinology care to Dr. Ingrid at [facility].  The patient reports that her endocrinologist told her the initial radioactive treatment did not work.  She experiences ongoing constipation, which she attributes to insufficient dietary fiber intake. No nausea, vomiting, or diarrhea.  Her current medications include calcium 1200 mg daily, taken as a single dose. Recent blood work showed a white blood cell count of 3500, slightly below normal but consistent with her history of lower counts. Her calcium level is 8.5, within normal limits.     I have reviewed the past medical history, past surgical history, social history and family history with the patient and they are unchanged from previous note.  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  She underwent thyroidectomy followed by radioactive iodine therapy.    She has been under regular monitoring with scans every six months. Recent scans have shown a slow-growing lung nodule in June 2024, which led to follow-up scans in January 2025.  She later had a whole-body I-131 scan on 01/04/2024 which showed activity in the bones, particularly in the left pelvic area and left chest.  Lung nodules were not prominent on this scan.   She was referred to us  in March 2025 for consideration of Xgeva  injections for bone metastatic disease from papillary thyroid  cancer.  Disease burden is low, but bone involvement is concerning.    Xgeva  (denosumab ) was proposed to control bone disease and prevent fractures. Risks include hypocalcemia and osteonecrosis of the jaw. Calcium and vitamin D  supplementation are necessary to mitigate hypocalcemia.     Treatment duration is indefinite, but a minimum of one year is planned, with regular scans to monitor disease progression.   Once dental clearance was obtained, she was started on Xgeva  from 02/01/2024, with plan to continue every 4 weeks.  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  ALLERGIES: She is allergic to other, soap, and amoxicillin.  MEDICATIONS:  Current Outpatient Medications  Medication Sig Dispense Refill   augmented betamethasone dipropionate (DIPROLENE-AF) 0.05 % ointment Apply 1 Application topically daily.     Calcium Carbonate-Vit D-Min (CALCIUM 1200 PO) Take 1 capsule by mouth daily.     cetirizine (ZYRTEC) 10 MG tablet Take 10 mg by mouth daily as needed for allergies.     denosumab  (XGEVA ) 120 MG/1.7ML SOLN injection 120 mg Subcutaneous Every 4 weeks     EPINEPHrine  0.3 mg/0.3 mL IJ SOAJ injection Inject 0.3 mg into the muscle as needed for anaphylaxis.     lamoTRIgine (LAMICTAL) 100 MG tablet Take 100 mg by mouth daily.     levothyroxine (SYNTHROID, LEVOTHROID) 112 MCG tablet Take 224 mcg by mouth daily.     No current facility-administered medications for this visit.     VITALS:   Blood pressure (!) 137/99, pulse 85, temperature 97.6 F (36.4 C), temperature source Temporal, resp. rate 14, weight 177 lb 9.6 oz (80.6 kg), SpO2 100%.  Wt Readings from Last 3 Encounters:  06/20/24 177 lb 9.6 oz (80.6 kg)  04/25/24 171 lb 6.4 oz (77.7 kg)  03/28/24 171 lb (77.6 kg)    Body mass index is 27.82 kg/m.    Onc Performance Status - 06/20/24 0934       ECOG Perf Status   ECOG Perf Status Restricted in physically strenuous activity but ambulatory and able to carry out work of a light or sedentary nature, e.g., light house work, office work      KPS SCALE   KPS % SCORE Able to carry on normal activity, minor s/s of disease           PHYSICAL EXAM:   Physical Exam Constitutional:      General: She is not in acute distress.    Appearance: Normal appearance.  HENT:     Head: Normocephalic and atraumatic.  Eyes:     General: No scleral icterus.    Conjunctiva/sclera: Conjunctivae normal.  Cardiovascular:     Rate and Rhythm: Normal rate and regular rhythm.      Heart sounds: Normal heart sounds.  Pulmonary:     Effort: Pulmonary effort is normal.     Breath sounds: Normal breath sounds.  Abdominal:     General: There is no distension.  Musculoskeletal:     Right lower leg: No edema.     Left lower leg: No edema.  Neurological:     General: No focal deficit present.     Mental Status: She is alert and oriented to person, place, and time.  Psychiatric:        Mood and Affect: Mood normal.        Behavior: Behavior normal.        Thought Content: Thought content normal.       LABORATORY DATA:   I have reviewed the data as listed.  Results for orders  placed or performed in visit on 06/20/24  Phosphorus  Result Value Ref Range   Phosphorus 2.7 2.5 - 4.6 mg/dL  Magnesium  Result Value Ref Range   Magnesium 2.0 1.7 - 2.4 mg/dL  CMP (Cancer Center only)  Result Value Ref Range   Sodium 140 135 - 145 mmol/L   Potassium 4.1 3.5 - 5.1 mmol/L   Chloride 108 98 - 111 mmol/L   CO2 27 22 - 32 mmol/L   Glucose, Bld 83 70 - 99 mg/dL   BUN 12 6 - 20 mg/dL   Creatinine 9.13 9.55 - 1.00 mg/dL   Calcium 8.5 (L) 8.9 - 10.3 mg/dL   Total Protein 6.9 6.5 - 8.1 g/dL   Albumin 4.4 3.5 - 5.0 g/dL   AST 13 (L) 15 - 41 U/L   ALT 8 0 - 44 U/L   Alkaline Phosphatase 52 38 - 126 U/L   Total Bilirubin 0.4 0.0 - 1.2 mg/dL   GFR, Estimated >39 >39 mL/min   Anion gap 5 5 - 15  CBC with Differential (Cancer Center Only)  Result Value Ref Range   WBC Count 3.5 (L) 4.0 - 10.5 K/uL   RBC 4.42 3.87 - 5.11 MIL/uL   Hemoglobin 12.6 12.0 - 15.0 g/dL   HCT 63.1 63.9 - 53.9 %   MCV 83.3 80.0 - 100.0 fL   MCH 28.5 26.0 - 34.0 pg   MCHC 34.2 30.0 - 36.0 g/dL   RDW 87.3 88.4 - 84.4 %   Platelet Count 190 150 - 400 K/uL   nRBC 0.0 0.0 - 0.2 %   Neutrophils Relative % 57 %   Neutro Abs 2.0 1.7 - 7.7 K/uL   Lymphocytes Relative 28 %   Lymphs Abs 1.0 0.7 - 4.0 K/uL   Monocytes Relative 11 %   Monocytes Absolute 0.4 0.1 - 1.0 K/uL   Eosinophils Relative 3 %    Eosinophils Absolute 0.1 0.0 - 0.5 K/uL   Basophils Relative 1 %   Basophils Absolute 0.0 0.0 - 0.1 K/uL   Immature Granulocytes 0 %   Abs Immature Granulocytes 0.01 0.00 - 0.07 K/uL      RADIOGRAPHIC STUDIES:  I have reviewed CT chest abdomen pelvis from 01/10/2024 and whole-body I-131 scan from 01/04/2024.  NM Whole Body I131 Scan S/P Ca Rx CLINICAL DATA:  Papillary thyroid carcinoma. Prior therapy with 104 millicuries 03/18/2010 and 137 millicuries 12/26/2023. Recurrent papillary thyroid carcinoma on diagnostic I 131 scan 01/04/2024. Suspicion bone metastasis. Increasing thyroglobulin level.  EXAM: NUCLEAR MEDICINE I-131 POST THERAPY WHOLE BODY SCAN  TECHNIQUE: The patient received 204 mCi I-131 sodium iodide for the treatment of thyroid cancer within the past 10 days. The patient returns today, and whole body scanning was performed in the anterior and posterior projections.  COMPARISON:  I 131 diagnostic scan 01/04/2024, CT 01/10/2024  FINDINGS: Mild uptake in the LEFT chest similar to comparison exam. Less uptake in the pelvis than comparison diagnostic I 131 scan. Focal uptake in the posterior LEFT pelvis again noted. activity is similar to comparison I 131 diagnostic scan.  Again demonstrated moderate uptake within liver suggesting metabolism thyroid hormone product.  IMPRESSION: 1. No evidence of progressive disease compared to diagnostic I 131 scan 01/04/2024. 2. Persistent activity in the LEFT chest wall and posterior LEFT pelvis. Concern for persistent skeletal metastasis occult to CT. 3. Consider Thyrogen stimulated FDG PET scan in the future for evaluation.  Electronically Signed   By: Jackquline Malva HERO.D.  On: 06/16/2024 16:23   Orders Placed This Encounter  Procedures   CBC with Differential (Cancer Center Only)    Standing Status:   Standing    Number of Occurrences:   6    Expiration Date:   06/20/2025   CMP (Cancer Center only)     Standing Status:   Standing    Number of Occurrences:   6    Expiration Date:   06/20/2025   Magnesium    Standing Status:   Standing    Number of Occurrences:   6    Expiration Date:   06/20/2025      This document was completed utilizing speech recognition software. Grammatical errors, random word insertions, pronoun errors, and incomplete sentences are an occasional consequence of this system due to software limitations, ambient noise, and hardware issues. Any formal questions or concerns about the content, text or information contained within the body of this dictation should be directly addressed to the provider for clarification.

## 2024-06-20 NOTE — Progress Notes (Signed)
 Ok to proceed with Xgeva  today with Ca=8.5 per Dr. Autumn.  Tammy Nichols, PharmD, [facility]

## 2024-06-23 ENCOUNTER — Telehealth: Payer: Self-pay | Admitting: Oncology

## 2024-06-23 NOTE — Telephone Encounter (Signed)
 I attempted to inform Tammy Nichols of her upcoming appointments. I was unable to leave a message as her voicemail is full.

## 2024-07-18 ENCOUNTER — Inpatient Hospital Stay: Attending: Oncology

## 2024-07-18 ENCOUNTER — Inpatient Hospital Stay

## 2024-07-18 VITALS — BP 135/88 | HR 82 | Temp 98.6°F | Resp 14

## 2024-07-18 DIAGNOSIS — Z85850 Personal history of malignant neoplasm of thyroid: Secondary | ICD-10-CM | POA: Insufficient documentation

## 2024-07-18 DIAGNOSIS — C73 Malignant neoplasm of thyroid gland: Secondary | ICD-10-CM

## 2024-07-18 DIAGNOSIS — C7951 Secondary malignant neoplasm of bone: Secondary | ICD-10-CM | POA: Insufficient documentation

## 2024-07-18 LAB — CMP (CANCER CENTER ONLY)
ALT: 14 U/L (ref 0–44)
AST: 16 U/L (ref 15–41)
Albumin: 4.6 g/dL (ref 3.5–5.0)
Alkaline Phosphatase: 55 U/L (ref 38–126)
Anion gap: 6 (ref 5–15)
BUN: 15 mg/dL (ref 6–20)
CO2: 24 mmol/L (ref 22–32)
Calcium: 9.3 mg/dL (ref 8.9–10.3)
Chloride: 108 mmol/L (ref 98–111)
Creatinine: 0.94 mg/dL (ref 0.44–1.00)
GFR, Estimated: >60 mL/min (ref >60)
Glucose, Bld: 110 mg/dL — ABNORMAL HIGH (ref 70–99)
Potassium: 4.3 mmol/L (ref 3.5–5.1)
Sodium: 138 mmol/L (ref 135–145)
Total Bilirubin: 0.4 mg/dL (ref 0.0–1.2)
Total Protein: 7.5 g/dL (ref 6.5–8.1)

## 2024-07-18 LAB — CBC WITH DIFFERENTIAL (CANCER CENTER ONLY)
Abs Immature Granulocytes: 0.01 K/uL (ref 0.00–0.07)
Basophils Absolute: 0 K/uL (ref 0.0–0.1)
Basophils Relative: 0 %
Eosinophils Absolute: 0.1 K/uL (ref 0.0–0.5)
Eosinophils Relative: 2 %
HCT: 35 % — ABNORMAL LOW (ref 36.0–46.0)
Hemoglobin: 12.2 g/dL (ref 12.0–15.0)
Immature Granulocytes: 0 %
Lymphocytes Relative: 35 %
Lymphs Abs: 1.5 K/uL (ref 0.7–4.0)
MCH: 28.6 pg (ref 26.0–34.0)
MCHC: 34.9 g/dL (ref 30.0–36.0)
MCV: 82 fL (ref 80.0–100.0)
Monocytes Absolute: 0.4 K/uL (ref 0.1–1.0)
Monocytes Relative: 8 %
Neutro Abs: 2.3 K/uL (ref 1.7–7.7)
Neutrophils Relative %: 55 %
Platelet Count: 144 K/uL — ABNORMAL LOW (ref 150–400)
RBC: 4.27 MIL/uL (ref 3.87–5.11)
RDW: 13.1 % (ref 11.5–15.5)
WBC Count: 4.3 K/uL (ref 4.0–10.5)
nRBC: 0 % (ref 0.0–0.2)

## 2024-07-18 LAB — MAGNESIUM: Magnesium: 2 mg/dL (ref 1.7–2.4)

## 2024-07-18 LAB — PHOSPHORUS: Phosphorus: 3.1 mg/dL (ref 2.5–4.6)

## 2024-07-18 MED ORDER — DENOSUMAB 120 MG/1.7ML ~~LOC~~ SOLN
120.0000 mg | Freq: Once | SUBCUTANEOUS | Status: AC
  Administered 2024-07-18: 120 mg via SUBCUTANEOUS
  Filled 2024-07-18: qty 1.7

## 2024-08-15 ENCOUNTER — Inpatient Hospital Stay: Attending: Oncology

## 2024-08-15 ENCOUNTER — Inpatient Hospital Stay

## 2024-08-15 ENCOUNTER — Inpatient Hospital Stay: Admitting: Oncology

## 2024-08-15 VITALS — BP 134/72 | HR 83 | Temp 97.9°F | Resp 17 | Ht 67.0 in | Wt 182.4 lb

## 2024-08-15 DIAGNOSIS — C73 Malignant neoplasm of thyroid gland: Secondary | ICD-10-CM

## 2024-08-15 DIAGNOSIS — C7951 Secondary malignant neoplasm of bone: Secondary | ICD-10-CM

## 2024-08-15 DIAGNOSIS — Z85850 Personal history of malignant neoplasm of thyroid: Secondary | ICD-10-CM | POA: Insufficient documentation

## 2024-08-15 LAB — CMP (CANCER CENTER ONLY)
ALT: 11 U/L (ref 0–44)
AST: 14 U/L — ABNORMAL LOW (ref 15–41)
Albumin: 4.5 g/dL (ref 3.5–5.0)
Alkaline Phosphatase: 58 U/L (ref 38–126)
Anion gap: 5 (ref 5–15)
BUN: 13 mg/dL (ref 6–20)
CO2: 27 mmol/L (ref 22–32)
Calcium: 8.9 mg/dL (ref 8.9–10.3)
Chloride: 107 mmol/L (ref 98–111)
Creatinine: 0.77 mg/dL (ref 0.44–1.00)
GFR, Estimated: >60 mL/min (ref >60)
Glucose, Bld: 73 mg/dL (ref 70–99)
Potassium: 3.8 mmol/L (ref 3.5–5.1)
Sodium: 139 mmol/L (ref 135–145)
Total Bilirubin: 0.4 mg/dL (ref 0.0–1.2)
Total Protein: 7.3 g/dL (ref 6.5–8.1)

## 2024-08-15 LAB — CBC WITH DIFFERENTIAL (CANCER CENTER ONLY)
Abs Immature Granulocytes: 0.01 K/uL (ref 0.00–0.07)
Basophils Absolute: 0 K/uL (ref 0.0–0.1)
Basophils Relative: 0 %
Eosinophils Absolute: 0.1 K/uL (ref 0.0–0.5)
Eosinophils Relative: 3 %
HCT: 35.2 % — ABNORMAL LOW (ref 36.0–46.0)
Hemoglobin: 12.2 g/dL (ref 12.0–15.0)
Immature Granulocytes: 0 %
Lymphocytes Relative: 25 %
Lymphs Abs: 1 K/uL (ref 0.7–4.0)
MCH: 28.8 pg (ref 26.0–34.0)
MCHC: 34.7 g/dL (ref 30.0–36.0)
MCV: 83.2 fL (ref 80.0–100.0)
Monocytes Absolute: 0.4 K/uL (ref 0.1–1.0)
Monocytes Relative: 10 %
Neutro Abs: 2.5 K/uL (ref 1.7–7.7)
Neutrophils Relative %: 62 %
Platelet Count: 140 K/uL — ABNORMAL LOW (ref 150–400)
RBC: 4.23 MIL/uL (ref 3.87–5.11)
RDW: 13.4 % (ref 11.5–15.5)
WBC Count: 4 K/uL (ref 4.0–10.5)
nRBC: 0 % (ref 0.0–0.2)

## 2024-08-15 LAB — PHOSPHORUS: Phosphorus: 3.2 mg/dL (ref 2.5–4.6)

## 2024-08-15 LAB — MAGNESIUM: Magnesium: 1.9 mg/dL (ref 1.7–2.4)

## 2024-08-15 MED ORDER — DENOSUMAB 120 MG/1.7ML ~~LOC~~ SOLN
120.0000 mg | Freq: Once | SUBCUTANEOUS | Status: AC
  Administered 2024-08-15: 120 mg via SUBCUTANEOUS
  Filled 2024-08-15: qty 1.7

## 2024-08-15 NOTE — Progress Notes (Signed)
 [facility]  ONCOLOGY CLINIC PROGRESS NOTE   Patient Care Team: Shelda Atlas, MD as PCP - General (Internal Medicine) Faythe Purchase, MD as Consulting Physician (Endocrinology)  PATIENT NAME: Tammy Nichols   MR#: [id number] DOB: Mar 19, 1971  Date of visit: 08/15/2024   ASSESSMENT & PLAN:   Tammy Nichols is a 53 y.o.  lady with a past medical history of papillary thyroid  cancer diagnosed in 2011 status post thyroidectomy followed by radioactive iodine therapy, remained on close surveillance under endocrinology department, was recently found to have bone metastatic disease on bone scan.  She was referred to our clinic in March 2025 for consideration of Xgeva  injections.  Her other comorbidities include seizure disorder.   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   She has been under regular monitoring with scans every six months. Recent scans have shown a slow-growing lung nodule in June 2024, which led to follow-up scans in January 2025. Lung nodules have minimally increased from 4mm to 5mm.   She later had a whole-body I-131 scan on 01/04/2024 which showed activity in the bones, particularly in the left pelvic area and left chest.  Lung nodules were not prominent on this scan.   Disease burden is low, but bone involvement is concerning.    Xgeva  (denosumab ) was proposed to control bone disease and prevent fractures. Risks include hypocalcemia and osteonecrosis of the jaw. Calcium and vitamin D  supplementation are necessary to mitigate hypocalcemia.     Treatment duration is indefinite, but a minimum of one year is planned, with regular scans to monitor disease progression.   Once dental clearance was obtained, she was started on Xgeva  from 02/01/2024, with plan to continue every 4 weeks.  Patient recently established with  [facility] end[facility], Dr. Ingrid.  On 07/29/2024, FNA from sacral bone lesion came back positive for metastatic thyroid  carcinoma.  She is scheduled to receive radiation treatments to the sacral mass at [facility].  Labs today reveal no dose-limiting toxicities.  Will proceed with Xgeva  dose today.  She was advised to continue oral calcium at home at 600 mg p.o. twice daily dosing.  We will recheck calcium and other labs on return visit.  RTC in 4 weeks for labs, and Xgeva  injections.  RTC in 8 weeks for labs, follow-up and Xgeva  injection.  Constipation Chronic constipation managed with MiraFiber gummies, which are effective. - Continue MiraFiber gummies for constipation management   I reviewed lab results and outside records for this visit and discussed relevant results with the patient. Diagnosis, plan of care and treatment options were also discussed in detail with the patient. Opportunity provided to ask questions and answers provided to her apparent satisfaction. Provided instructions to call our clinic with any problems, questions or concerns prior to return visit. I recommended to continue follow-up with PCP and sub-specialists. She verbalized understanding and agreed with the plan.   NCCN guidelines have been consulted in the planning of this patient's care.  I spent a total of 30 minutes during this encounter with the patient including review of chart and various tests results, discussions about plan of care and coordination of care plan.   Chinita Patten, MD  08/15/2024 2:50 PM   [facility] - A DEPT OF [facility] [address] [place] [address] Dept: [phone] Dept Fax:  [phone]    CHIEF COMPLAINT/ REASON FOR VISIT:   Papillary thyroid  cancer with bone metastatic disease  Current Treatment: Started Xgeva  for bone metastatic disease from March 2025.  INTERVAL HISTORY:    Discussed the use  of AI scribe software for clinical note transcription with the patient, who gave verbal consent to proceed.  History of Present Illness  Tammy Nichols is a 53 year old female with metastatic thyroid  cancer who presents for follow-up regarding her radiation treatment plan.  She recently underwent a biopsy at [facility] approximately two to three weeks ago, confirming metastatic thyroid  cancer to the pelvis. She is under the care of a radiation oncologist and is scheduled for a 3D MRI and CT scan to assist in planning her radiation therapy. The exact number of radiation treatments has not yet been determined, but sessions will occur every other day.  She has a history of constipation, which she manages with MiraFiber gummies.    I have reviewed the past medical history, past surgical history, social history and family history with the patient and they are unchanged from previous note.  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  She underwent thyroidectomy followed by radioactive iodine therapy.    She has been under regular monitoring with scans every six months. Recent scans have shown a slow-growing lung nodule in June 2024, which led to follow-up scans in January 2025.  She later had a whole-body I-131 scan on 01/04/2024 which showed activity in the bones, particularly in the left pelvic area and left chest.  Lung nodules were not prominent on this scan.   She was referred to us  in March 2025 for consideration of Xgeva  injections for bone metastatic disease from papillary thyroid  cancer.  Disease burden is low, but bone involvement is concerning.    Xgeva  (denosumab ) was proposed to control bone disease and prevent fractures. Risks include hypocalcemia and osteonecrosis of the jaw. Calcium and vitamin D  supplementation are necessary to mitigate hypocalcemia.     Treatment duration is  indefinite, but a minimum of one year is planned, with regular scans to monitor disease progression.   Once dental clearance was obtained, she was started on Xgeva  from 02/01/2024, with plan to continue every 4 weeks.  On 07/29/2024, FNA from sacral bone lesion came back positive for metastatic thyroid  carcinoma.  She is scheduled to receive radiation treatments to the sacral mass at [facility].  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  ALLERGIES: She is allergic to other, soap, and amoxicillin.  MEDICATIONS:  Current Outpatient Medications  Medication Sig Dispense Refill   augmented betamethasone dipropionate (DIPROLENE-AF) 0.05 % ointment Apply 1 Application topically daily.     Calcium Carbonate-Vit D-Min (CALCIUM 1200 PO) Take 1 capsule by mouth daily.     cetirizine (ZYRTEC) 10 MG tablet Take 10 mg by  mouth daily as needed for allergies.     denosumab  (XGEVA ) 120 MG/1.7ML SOLN injection 120 mg Subcutaneous Every 4 weeks     EPINEPHrine  0.3 mg/0.3 mL IJ SOAJ injection Inject 0.3 mg into the muscle as needed for anaphylaxis.     lamoTRIgine (LAMICTAL) 100 MG tablet Take 100 mg by mouth daily.     levothyroxine (SYNTHROID, LEVOTHROID) 112 MCG tablet Take 224 mcg by mouth daily.     No current facility-administered medications for this visit.     VITALS:   Blood pressure 134/72, pulse 83, temperature 97.9 F (36.6 C), temperature source  Temporal, resp. rate 17, height 5' 7 (1.702 m), weight 182 lb 6.4 oz (82.7 kg), SpO2 98%.  Wt Readings from Last 3 Encounters:  08/15/24 182 lb 6.4 oz (82.7 kg)  06/20/24 177 lb 9.6 oz (80.6 kg)  04/25/24 171 lb 6.4 oz (77.7 kg)    Body mass index is 28.57 kg/m.    Onc Performance Status - 08/15/24 1400       ECOG Perf Status   ECOG Perf Status Ambulatory and capable of all selfcare but unable to carry out any work activities.  Up and about more than 50% of waking hours      KPS SCALE   KPS % SCORE Normal activity with effort, some s/s of disease           PHYSICAL EXAM:   Physical Exam Constitutional:      General: She is not in acute distress.    Appearance: Normal appearance.  HENT:     Head: Normocephalic and atraumatic.  Eyes:     General: No scleral icterus.    Conjunctiva/sclera: Conjunctivae normal.  Cardiovascular:     Rate and Rhythm: Normal rate and regular rhythm.     Heart sounds: Normal heart sounds.  Pulmonary:     Effort: Pulmonary effort is normal.     Breath sounds: Normal breath sounds.  Abdominal:     General: There is no distension.  Musculoskeletal:     Right lower leg: No edema.     Left lower leg: No edema.  Neurological:     General: No focal deficit present.     Mental Status: She is alert and oriented to person, place, and time.  Psychiatric:        Mood and Affect: Mood normal.        Behavior: Behavior normal.        Thought Content: Thought content normal.       LABORATORY DATA:   I have reviewed the data as listed.  Results for orders placed or performed in visit on 08/15/24  Magnesium  Result Value Ref Range   Magnesium 1.9 1.7 - 2.4 mg/dL  CMP (Cancer Center only)  Result Value Ref Range   Sodium 139 135 - 145 mmol/L   Potassium 3.8 3.5 - 5.1 mmol/L   Chloride 107 98 - 111 mmol/L   CO2 27 22 - 32 mmol/L   Glucose, Bld 73 70 - 99 mg/dL   BUN 13 6 - 20 mg/dL   Creatinine 9.22 9.55 - 1.00 mg/dL   Calcium 8.9 8.9  - 89.6 mg/dL   Total Protein 7.3 6.5 - 8.1 g/dL   Albumin 4.5 3.5 - 5.0 g/dL   AST 14 (L) 15 - 41 U/L   ALT 11 0 - 44 U/L   Alkaline Phosphatase 58 38 - 126 U/L   Total Bilirubin 0.4 0.0 - 1.2 mg/dL  GFR, Estimated >60 >60 mL/min   Anion gap 5 5 - 15  CBC with Differential (Cancer Center Only)  Result Value Ref Range   WBC Count 4.0 4.0 - 10.5 K/uL   RBC 4.23 3.87 - 5.11 MIL/uL   Hemoglobin 12.2 12.0 - 15.0 g/dL   HCT 64.7 (L) 63.9 - 53.9 %   MCV 83.2 80.0 - 100.0 fL   MCH 28.8 26.0 - 34.0 pg   MCHC 34.7 30.0 - 36.0 g/dL   RDW 86.5 88.4 - 84.4 %   Platelet Count 140 (L) 150 - 400 K/uL   nRBC 0.0 0.0 - 0.2 %   Neutrophils Relative % 62 %   Neutro Abs 2.5 1.7 - 7.7 K/uL   Lymphocytes Relative 25 %   Lymphs Abs 1.0 0.7 - 4.0 K/uL   Monocytes Relative 10 %   Monocytes Absolute 0.4 0.1 - 1.0 K/uL   Eosinophils Relative 3 %   Eosinophils Absolute 0.1 0.0 - 0.5 K/uL   Basophils Relative 0 %   Basophils Absolute 0.0 0.0 - 0.1 K/uL   Immature Granulocytes 0 %   Abs Immature Granulocytes 0.01 0.00 - 0.07 K/uL      RADIOGRAPHIC STUDIES:  I have reviewed CT chest abdomen pelvis from 01/10/2024 and whole-body I-131 scan from 01/04/2024.  NM Whole Body I131 Scan S/P Ca Rx CLINICAL DATA:  Papillary thyroid  carcinoma. Prior therapy with 104 millicuries 03/18/2010 and 137 millicuries 12/26/2023. Recurrent papillary thyroid  carcinoma on diagnostic I 131 scan 01/04/2024. Suspicion bone metastasis. Increasing thyroglobulin level.  EXAM: NUCLEAR MEDICINE I-131 POST THERAPY WHOLE BODY SCAN  TECHNIQUE: The patient received 204 mCi I-131 sodium iodide for the treatment of thyroid  cancer within the past 10 days. The patient returns today, and whole body scanning was performed in the anterior and posterior projections.  COMPARISON:  I 131 diagnostic scan 01/04/2024, CT 01/10/2024  FINDINGS: Mild uptake in the LEFT chest similar to comparison exam. Less uptake in the pelvis than  comparison diagnostic I 131 scan. Focal uptake in the posterior LEFT pelvis again noted. activity is similar to comparison I 131 diagnostic scan.  Again demonstrated moderate uptake within liver suggesting metabolism thyroid  hormone product.  IMPRESSION: 1. No evidence of progressive disease compared to diagnostic I 131 scan 01/04/2024. 2. Persistent activity in the LEFT chest wall and posterior LEFT pelvis. Concern for persistent skeletal metastasis occult to CT. 3. Consider Thyrogen  stimulated FDG PET scan in the future for evaluation.  Electronically Signed   By: Jackquline Boxer M.D.   On: 06/16/2024 16:23   Orders Placed This Encounter  Procedures   CBC with Differential (Cancer Center Only)    Standing Status:   Standing    Number of Occurrences:   6    Expiration Date:   09/10/2025   CMP (Cancer Center only)    Standing Status:   Standing    Number of Occurrences:   6    Expiration Date:   09/10/2025   Magnesium    Standing Status:   Standing    Number of Occurrences:   6    Expiration Date:   09/10/2025     This document was completed utilizing speech recognition software. Grammatical errors, random word insertions, pronoun errors, and incomplete sentences are an occasional consequence of this system due to software limitations, ambient noise, and hardware issues. Any formal questions or concerns about the content, text or information contained within the body of this dictation should be directly addressed to the  provider for clarification.

## 2024-08-28 NOTE — Progress Notes (Signed)
 .. Subjective   Patient ID:  Tammy Nichols is a 53 y.o. (DOB Feb 19, 1971) female    Patient presents with  . Cough    Cough, sore throat, runny nose x 3 days     History of Present Illness This is a 53 year old female with a history of seizures and metastasis of thyroid  cancer to the pelvic bone presenting with cough, sore throat, and runny nose for 3 days.  She reports a productive cough with greenish sputum, accompanied by a sore throat and runny nose. She has not experienced any fevers or chills. 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 She has been maintaining good hydration. She has no history of asthma but mentions a tendency to develop bronchitis quickly. She has not yet started any treatment for her cough. She has previously used an albuterol  inhaler without any adverse effects. She has Claritin at home.  SOCIAL HISTORY She works at a school.  FAMILY HISTORY Her mother has a history of asthma.   Reviewed and updated this visit by provider: Tobacco  Allergies  Meds  Problems  Med Hx  Surg Hx  Fam Hx        Review of Systems  Constitutional:  Positive for fever.  HENT:  Positive for congestion and sore throat.        Runny niose  Respiratory:  Positive for cough.   All other systems reviewed and are negative.    Objective   Vitals:   08/28/24 1853  BP: 131/89  Patient Position: Sitting  Pulse: 116  Temp: 99.7 F (37.6 C)  TempSrc: Oral  Resp: 20  Height: 5' 6 (1.676 m)  Weight: 177 lb 9.6 oz (80.6 kg)  SpO2: 97%  BMI (Calculated): 28.7    Physical Exam ENT: Postnasal drip and redness noted at the back of the throat. Respiratory: Bilateral breath sounds clear to auscultation.  Physical Exam Vitals and nursing note reviewed.  Constitutional:      General: She is not in acute distress.    Appearance: Normal appearance. She is not ill-appearing, toxic-appearing or  diaphoretic.     Comments: Persistent cough during examination   HENT:     Head: Normocephalic and atraumatic.     Right Ear: Tympanic membrane is bulging.     Left Ear: Tympanic membrane is bulging.     Nose: Congestion present.     Right Turbinates: Swollen.     Left Turbinates: Swollen.     Mouth/Throat:     Lips: Pink.     Mouth: Mucous membranes are moist.     Pharynx: Uvula midline. Posterior oropharyngeal erythema and postnasal drip present.  Cardiovascular:     Rate and Rhythm: Regular rhythm. Tachycardia present.     Heart sounds: Normal heart sounds.  Pulmonary:     Effort: Pulmonary effort is normal.     Breath sounds: Normal breath sounds.  Musculoskeletal:     Cervical back: Normal range of motion.  Skin:    General: Skin is warm and dry.  Neurological:     General: No focal deficit present.     Mental Status: She is alert and oriented to person, place, and time.  Psychiatric:        Mood and Affect: Mood normal.        Behavior: Behavior normal.        Thought Content: Thought content normal.  Judgment: Judgment normal.      PHQ9       * No data to display         GAD7 Review       * No data to display          No results found for this or any previous visit (from the past 24 hours).  No results found for: WBC, HGB, HCT, PLT, CHOL, TRIG, HDL, LDL, ALT, AST, NA, K, CL, CREATININE, BUN, CO2, TSH, PSA, INR, GLUCOSE, HGBA1C, MICROALBUMIN  Assessment and Plan   Assessment & Plan Initial Assessment: 53 year old female with cough, sore throat, runny nose x 1 day. Negative COVID-19 test.  Differential Diagnosis: - Viral upper respiratory infection: Negative COVID-19 test, symptoms improving. Claritin, Flonase, vitamin C. - Bronchitis: Productive cough with greenish sputum, sore throat, runny nose. Prednisone  taper, albuterol  inhaler, Tessalon  Perles, Z-Pak, Claritin, cough syrup.  Urgent Care  Course: - COVID-19 test performed, result negative. - Prescribed prednisone  taper for bronchospasms and inflammation. - Prescribed albuterol  inhaler for shortness of breath, wheezing, persistent cough. - Prescribed Tessalon  Perles for daytime cough management. - Prescribed Z-Pak for infection. - Recommended Claritin daily for 30 days. - Prescribed cough syrup for bedtime or home use. - Advised adequate hydration with water or electrolyte drinks.  Final Assessment: COVID-19 test negative. Treatment initiated for bronchitis with prednisone  taper, albuterol  inhaler, Tessalon  Perles, Z-Pak, Claritin, and cough syrup. Patient advised on hydration.  Clinical Impression: 1. Acute bacterial bronchitis (Primary) 2. Cough, unspecified type Acute onset. Exam consistent with diagnosis. Proceed with treatment plan as outlined. Discussed with patient the importance of completing full course of antibiotic therapy as prescribed. Discussed with patient the use of OTC Tylenol  at home as needed for pain or fever. Instructed to increase fluid intake at home and rest to assist with symptoms.  - azithromycin (ZITHROMAX Z-PAK) 250 mg tablet; Take two tablets (500 mg dose) by mouth daily for 1 day, THEN one tablet (250 mg dose) daily for 4 days. Take 2 tablets (500 mg) on  Day 1,  followed by 1 tablet (250 mg) once daily on Days 2 through 5.  Dispense: 6 tablet; Refill: 0 - POCT CoVid-19 nucleic acid - benzonatate  (TESSALON  PERLES) 100 mg capsule; Take one capsule (100 mg dose) by mouth 3 (three) times a day as needed for Cough (daytime cough) for up to 14 days.  Dispense: 42 capsule; Refill: 0 - predniSONE  10 MG (21) TBPK; Take by mouth daily for 6 days. Follow 6 day taper pack instructions  Dispense: 21 tablet; Refill: 0 - albuterol  sulfate HFA (PROVENTIL ,VENTOLIN ,PROAIR ) 108 (90 Base) MCG/ACT inhaler; Inhale one puff into the lungs every 6 (six) hours as needed for Wheezing or Shortness of Breath for up to 30  days.  Dispense: 8 g; Refill: 0 - promethazine-dextromethorphan (PROMETHAZINE-DM) 6.25-15 MG/5ML syrup; Take 5 mLs by mouth 4 (four) times a day as needed for Cough for up to 7 days.  Dispense: 118 mL; Refill: 0   Disposition: - Follow-Up: Work note for tomorrow. Return to work on Monday.  Patient Education: Hydration advice. Use of prescribed medications. Rinse mouth after inhaler use. Claritin daily for 30 days.   Discussed with patient desired effects of all prescribed medications, any potential side effects, along with schedule and correct administration of each medication. Also discussed red flag signs and symptoms of worsening condition, when to call the office, and when to seek a higher level of care.  Patient verbalizes understanding and agrees with  treatment plan at this time.  Will continue to monitor.   I discussed with patient that since they have My Chart that they will more than likely get notification of their test results prior to me or another provider getting to see them.  I explained that as soon as we see the results we will notify them of any changes that may need to be made.  I explained to them that we sometimes don't get to look at our in baskets for lab results and messages until the end of each work day and that they will hear from us  as soon as we see them.  Pt verbalized understanding at this time.  Follow up in about 1 week (around 09/04/2024) for with PCP for worsening symptoms.      Patient's Medications       * Accurate as of August 28, 2024 11:59 PM. Reflects encounter med changes as of last refresh          New Prescriptions      Instructions  albuterol  sulfate HFA 108 (90 Base) MCG/ACT inhaler Commonly known as: PROVENTIL ,VENTOLIN ,PROAIR  Started by: Ivette Cintron-Cubero, FNP  1 puff, Inhalation, Every 6 hours as needed   azithromycin 250 mg tablet Commonly known as: ZITHROMAX Z-PAK Start taking on: August 28, 2024 Started by: Ivette  Cintron-Cubero, FNP  Take two tablets (500 mg dose) by mouth daily for 1 day, THEN one tablet (250 mg dose) daily for 4 days. Take 2 tablets (500 mg) on  Day 1,  followed by 1 tablet (250 mg) once daily on Days 2 through 5.   benzonatate  100 mg capsule Commonly known as: TESSALON  PERLES Started by: Ivette Cintron-Cubero, FNP  100 mg, Oral, 3 times a day as needed   predniSONE  10 MG (21) Tbpk Started by: Ivette Cintron-Cubero, FNP  Oral, Daily, Follow 6 day taper pack instructions   promethazine-dextromethorphan 6.25-15 MG/5ML syrup Commonly known as: PROMETHAZINE-DM Started by: Ivette Cintron-Cubero, FNP  5 mLs, Oral, 4 times a day as needed       Continued Medications      Instructions  lamoTRIgine 100 mg tablet Commonly known as: LAMICTAL  100 mg, Daily   levothyroxine sodium 112 mcg tablet Commonly known as: SYNTHROID,LEVOTHROID,LEVOXYL  224 mcg, Daily   XGEVA  120 MG/1.7ML injection Generic drug: denosumab   120 mg Subcutaneous Every 4 weeks          Risks, benefits, and alternatives of the medications and treatment plan prescribed today were discussed, and patient expressed understanding. Plan follow-up as discussed or as needed if any worsening symptoms or change in condition.    A yearly preventative health exam was recommended and current age based recommendations were discussed.  I have reviewed the information contained in this note and personally verified its accuracy.  MDM billing - I personally developed the plan of care based on documented medical decision making. Ivette Efraim Banco, FNP  Attestation: Portions of the history and exam were entered using voice recognition software. Minor syntax, contextual, and spelling errors may be related to the use of this software and were not intentional. If corrections are necessary, please contact provider.  *Some images could not be shown.

## 2024-09-10 ENCOUNTER — Encounter: Payer: Self-pay | Admitting: Oncology

## 2024-09-10 NOTE — Assessment & Plan Note (Signed)
 Papillary thyroid  cancer, initially diagnosed in 2011, was treated with total thyroidectomy and radioactive iodine therapy.   She has been under regular monitoring with scans every six months. Recent scans have shown a slow-growing lung nodule in June 2024, which led to follow-up scans in January 2025. Lung nodules have minimally increased from 4mm to 5mm.   She later had a whole-body I-131 scan on 01/04/2024 which showed activity in the bones, particularly in the left pelvic area and left chest.  Lung nodules were not prominent on this scan.   Disease burden is low, but bone involvement is concerning.    Xgeva  (denosumab ) was proposed to control bone disease and prevent fractures. Risks include hypocalcemia and osteonecrosis of the jaw. Calcium and vitamin D  supplementation are necessary to mitigate hypocalcemia.     Treatment duration is indefinite, but a minimum of one year is planned, with regular scans to monitor disease progression.   Once dental clearance was obtained, she was started on Xgeva  from 02/01/2024, with plan to continue every 4 weeks.  Patient recently established with [facility], Dr. Ingrid.  On 07/29/2024, FNA from sacral bone lesion came back positive for metastatic thyroid  carcinoma.  She is scheduled to receive radiation treatments to the sacral mass at [facility].  Labs today reveal no dose-limiting toxicities.  Will proceed with Xgeva  dose today.  She was advised to continue oral calcium at home at 600 mg p.o. twice daily dosing.  We will recheck calcium and other labs on return visit.  RTC in 4 weeks for labs, and Xgeva  injections.  RTC in 8 weeks for labs, follow-up and Xgeva  injection.

## 2024-09-18 ENCOUNTER — Encounter: Payer: Self-pay | Admitting: Oncology

## 2024-09-19 ENCOUNTER — Inpatient Hospital Stay: Attending: Oncology

## 2024-09-19 ENCOUNTER — Inpatient Hospital Stay

## 2024-09-19 ENCOUNTER — Telehealth: Payer: Self-pay

## 2024-09-19 VITALS — BP 138/73 | HR 81 | Temp 99.0°F | Resp 18

## 2024-09-19 DIAGNOSIS — Z85850 Personal history of malignant neoplasm of thyroid: Secondary | ICD-10-CM | POA: Insufficient documentation

## 2024-09-19 DIAGNOSIS — C7951 Secondary malignant neoplasm of bone: Secondary | ICD-10-CM | POA: Insufficient documentation

## 2024-09-19 DIAGNOSIS — C73 Malignant neoplasm of thyroid gland: Secondary | ICD-10-CM

## 2024-09-19 LAB — CBC WITH DIFFERENTIAL (CANCER CENTER ONLY)
Abs Immature Granulocytes: 0.01 K/uL (ref 0.00–0.07)
Basophils Absolute: 0 K/uL (ref 0.0–0.1)
Basophils Relative: 0 %
Eosinophils Absolute: 0.1 K/uL (ref 0.0–0.5)
Eosinophils Relative: 2 %
HCT: 34.4 % — ABNORMAL LOW (ref 36.0–46.0)
Hemoglobin: 11.8 g/dL — ABNORMAL LOW (ref 12.0–15.0)
Immature Granulocytes: 0 %
Lymphocytes Relative: 34 %
Lymphs Abs: 1.3 K/uL (ref 0.7–4.0)
MCH: 29.1 pg (ref 26.0–34.0)
MCHC: 34.3 g/dL (ref 30.0–36.0)
MCV: 84.9 fL (ref 80.0–100.0)
Monocytes Absolute: 0.4 K/uL (ref 0.1–1.0)
Monocytes Relative: 9 %
Neutro Abs: 2 K/uL (ref 1.7–7.7)
Neutrophils Relative %: 55 %
Platelet Count: 144 K/uL — ABNORMAL LOW (ref 150–400)
RBC: 4.05 MIL/uL (ref 3.87–5.11)
RDW: 12.8 % (ref 11.5–15.5)
WBC Count: 3.7 K/uL — ABNORMAL LOW (ref 4.0–10.5)
nRBC: 0 % (ref 0.0–0.2)

## 2024-09-19 LAB — CMP (CANCER CENTER ONLY)
ALT: 10 U/L (ref 0–44)
AST: 12 U/L — ABNORMAL LOW (ref 15–41)
Albumin: 4.3 g/dL (ref 3.5–5.0)
Alkaline Phosphatase: 51 U/L (ref 38–126)
Anion gap: 6 (ref 5–15)
BUN: 19 mg/dL (ref 6–20)
CO2: 26 mmol/L (ref 22–32)
Calcium: 9.3 mg/dL (ref 8.9–10.3)
Chloride: 110 mmol/L (ref 98–111)
Creatinine: 0.84 mg/dL (ref 0.44–1.00)
GFR, Estimated: >60 mL/min (ref >60)
Glucose, Bld: 97 mg/dL (ref 70–99)
Potassium: 4 mmol/L (ref 3.5–5.1)
Sodium: 142 mmol/L (ref 135–145)
Total Bilirubin: 0.3 mg/dL (ref 0.0–1.2)
Total Protein: 6.8 g/dL (ref 6.5–8.1)

## 2024-09-19 LAB — MAGNESIUM: Magnesium: 1.9 mg/dL (ref 1.7–2.4)

## 2024-09-19 LAB — PHOSPHORUS: Phosphorus: 3.2 mg/dL (ref 2.5–4.6)

## 2024-09-19 MED ORDER — DENOSUMAB 120 MG/1.7ML ~~LOC~~ SOLN
120.0000 mg | Freq: Once | SUBCUTANEOUS | Status: AC
  Administered 2024-09-19: 120 mg via SUBCUTANEOUS
  Filled 2024-09-19: qty 1.7

## 2024-09-19 NOTE — Telephone Encounter (Signed)
 Patient contacted and made aware of new appointments for lab work and injection for Friday, 09/19/2024 starting at 1445. Patient agreed with appointments and will scheduling her next appointments in 4 weeks per her preference due to work schedule.

## 2024-09-28 NOTE — Progress Notes (Signed)
 [facility] - NEW CONSULT NOTE   DATE OF VISIT:   09/29/2024   REASON FOR CLINIC VISIT: Papillary thyroid  cancer    DIAGNOSIS: Recurrent metastatic papillary thyroid  cancer   MOLECULAR ANALYSIS: NA  HISTORY OF PRESENT ILLNESS/ONCOLOGY HISTORY:  Tammy Nichols is a 53 y.o. female who was diagnosed with papillary thyroid  cancer in 2011. She underwent thyroidectomy followed by RAI 136.7 mCi I-131 at that time. She remained clinically stable until 2024 when she was noted to have progressive pulmonary metastatic disease with a 4mm left anterior upper lobe nodule and 3mm subpleural RLL nodule. She underwent RAI 136.7 mCi I-131 12/2023. CT CAP showed no e/o disease other than stable 5mm lingular nodule. She underwent additional RAI 06/04/24. Post-tx scan 06/2024 showed persistent activity in the left chest wall and posterior left pelvis concerning for persistent skeletal metastases. PET 07/23/24 was notable for an FDG avid osseous lesion in the left sacrum. No bony correlate was seen for the left chest wall abnormality noted on prior imaging in 12/2023 and 06/2024. There was a stable left lingular nodule without definite uptake. Sacral bone biopsy 07/29/24 confirmed metastatic thyroid  carcinoma.09/09/24-09/15/24: S/p SBRT, 30 Gy/3Fx  Oncologic history: 2011: PTC s/p thyroidectomy 03/18/2010-100 mCi of RAI 10/26/2023.  CT neck chest-5 mm lingular nodule.  Post thyroidectomy without any signs of disease recurrence in the neck 12/26/2023-137 mCi of RAI 01/04/2024 NM 131 posttherapy whole-body scan-focus of radiotracer activity in the left lateral chest, second focus of more intense activity in posterior left region. 01/10/2024 CT chest abdomen pelvis -  stable 5 mm pulmonary nodule in the lingula.  No evidence of metastatic disease in abdomen pelvis 02/01/24-current: Xgeva  q4 weeks 06/04/2024-200 mCi of RAI 06/13/2024 NM 131 posttherapy whole-body scan-persistent activity in left chest  wall and posterior left pelvic concerning for persistent skeletal metastases. 06/18/2024-Serum Thyroglobulin- 59. Tg Ab- negative. serum TSH- 0.18 07/23/24: PET scan showed  hypermetabolic osseous lesion within the left sacrum suspicious for metastatic disease 07/29/24:S/p bone biopsy confirmed metastatic thyroid  carcinoma 09/09/24-09/15/24: S/p SBRT, 30 Gy/3Fx  History of Present Illness Tammy Nichols is a 53 year old female with metastatic papillary thyroid  cancer who presents for follow-up after recent radiation therapy. She was referred by Dr. Muzaffar for treatment of metastatic disease.  Initially diagnosed with papillary thyroid  cancer in 2011, she underwent surgery. Over the past two years, she has received multiple radioactive ablations due to concerns of metastatic disease recurrence. A recent PET scan in September showed sacral bone metastatic disease, for which she completed radiation therapy.  She reports receiving Xgeva  injections every four weeks. She takes 150 mcg of thyroid  medication daily in the morning on an empty stomach.  She experiences dry mouth and bleeding gums, which she attributes to using coconut oil for swishing. She describes a sensation of not producing enough saliva. She confirms experiencing dry mouth.  She experiences constipation regularly but does not currently take any medication for it. She confirms experiencing constipation.  She reports pain in the sacral area, which has improved since radiation therapy. The pain is currently rated as a 3 out of 10 and she takes 650 mg of Tylenol  twice daily for relief. During the week of her radiation, she was prescribed oxycodone  for pain management.   PAST MEDICAL HISTORY:  Patient Active Problem List  Diagnosis   Thyroid  carcinoma (CMS/HHS-HCC)     Past Medical History:  Diagnosis Date   Seizures (CMS/HHS-HCC)      Past Surgical History:  Procedure Laterality Date   THYROIDECTOMY        SOCIAL  HISTORY: Social History   Socioeconomic History   Marital status: Widowed  Tobacco Use   Smoking status: Never   Smokeless tobacco: Never  Vaping Use   Vaping status: Never Used   Social Drivers of Health   Housing Stability: Unknown (06/13/2024)   Housing Stability Vital Sign    Homeless in the Last Year: No     FAMILY HISTORY: Family History  Problem Relation Age of Onset   Prostate cancer Father    Breast cancer Maternal Grandmother    Prostate cancer Paternal Grandmother       ALLERGIES: Allergies  Allergen Reactions   Tree Nuts Anaphylaxis and Other (See Comments)   Amoxicillin Rash     MEDICATIONS:  Current Outpatient Medications  Medication Sig Dispense Refill   calcium carbonate 600 mg calcium (1,500 mg) Tab tablet 2 tablets with lunch or evening meal Orally Once a day; Duration: 30 days     cholecalciferol, vitamin D3, 1,250 mcg (50,000 unit) Wafr 1 capsule     denosumab  (XGEVA ) 120 mg/1.7 mL (70 mg/mL) injection 120 mg Subcutaneous Every 4 weeks     lamoTRIgine (LAMICTAL) 100 MG tablet Take 100 mg by mouth once daily     levothyroxine (SYNTHROID) 112 MCG tablet Take 224 mcg by mouth once daily     No current facility-administered medications for this visit.      ROS: A 14 point ROS was reviewed and pertinent positives are noted in the HPI.   Immunization History  Administered Date(s) Administered   COVID-19 Pfizer Monovalent Vaccine 01/01/2020, 01/27/2020     PHYSICAL EXAM: Vitals:   09/29/24 0943  BP: 124/83  Patient Position: Sitting  BP Cuff Size: Adult  Pulse: 73  Resp: 18  Temp: 36.6 C (97.9 F)  TempSrc: Oral  SpO2: 98%  Weight: 82.7 kg (182 lb 5.1 oz)  Height: 168.9 cm (5' 6.5)   : ECOG Score: (1) Restricted in physically strenuous activity, ambulatory and able to do work of light nature    GENERAL:  NAD.  Well developed, well nourished. HEENT:  Moist mucus membranes.  Oropharynx clear.  Anicteric  sclera. CARDIAC:  RRR.  No clicks, rubs, or murmurs.S1, S2 heard RESP:  CTAB.  No wheezes, crackles, or rhonchi. GI:  Soft, non-tender, non-distended.  Bowel sounds present.  No hepatosplenomegaly. MSK:  No clubbing, cyanosis, or edema. NEURO:  Patient is alert.  No focal motor or sensory deficits. SKIN:  No open lesions or rashes.    LABS: No visits with results within 1 Week(s) from this visit.  Latest known visit with results is:  Hospital Outpatient Visit on 07/29/2024  Component Date Value Ref Range Status   Case Report 07/29/2024    Final                   Value:Fine Needle Aspirate                              Case: [id number]                                Authorizing Provider:  Muzaffar, Jameel, MD       Collected:           07/29/2024 1106  Ordering Location:     [facility]              Received:            07/29/2024 1319             Specimen:    Bone, secrum mass                                                                        Specimen Source A 07/29/2024    Final                   Value:Bone, sacrum mass    Diagnostic Interpretation A 07/29/2024 DIAGNOSTIC OF MALIGNANCY.   Final     07/29/2024    Final                   Value:Bone with metastatic thyroid  carcinoma.  NOTE: Cell blocks A1 and A2 contain viable tumor for ancillary studies as clinically indicated.    Procedure Notes 07/29/2024    Final                   Value:CT-guided Needle Core Biopsy of sacral mass performed by Dr. Lovella, Radiologist.     Clinical Information 07/29/2024    Final                   Value:Patient with a history of thyroid  carcinoma (2011) s/p thyroidectomy / RAI presents with concern for metastatic disease.     Material Submitted 07/29/2024    Final                   Value:A1) 20 mls blood-tinged formalin received. 4 air dried smear(s) received. 1 core biopsy received.   A2) 20 mls blood-tinged formalin received. 1 core biopsy received.       Immediate Assessment A 07/29/2024    Final                   Value:Adequate low cellularity. (by Olam Hutchinson, CT)     Additional Documentation 07/29/2024    Final                   Value:All immunohistochemistry, in situ hybridization tests and special stains performed at [facility] and reported herein were developed, validated and their performance characteristics determined by the [facility]. During the performance of these tests, appropriate positive and negative control slides are also performed and reviewed. All control slides and internal controls (when applicable) demonstrate the expected immunoreactive patterns and/or nucleic acid hybridization. These ancillary studies were deemed medically necessary by the requesting pathologist. They were ordered following review of the H&E and clinical history except when part of a liver/kidney protocol or where clinical history (e.g. immunocompromised, critically ill, history of malignancy) clearly indicates. Some of the tests may not be cleared or approved by the U.S. Food and Drug Administration (FDA).  The FDA has determined that such clearance or approval is not necessary.  These tests                          are used for clinical purposes and should  not be regarded as investigational or as research.  This laboratory is certified under the Clinical Laboratory Improvement Amendments of 1988 (CLIA) as qualified to perform high complexity clinical testing.    Attestation 07/29/2024    Final                   Value:All of the diagnostic evaluations on the enumerated specimens have been personally conducted by the pathologists involved in the care of this patient as indicated by the electronic signatures above.      DIAGNOSTIC STUDIES:   Personally reviewed and interpreted.      ASSESSMENT AND PLAN:  Assessment & Plan Metastatic papillary thyroid  cancer with bone and lung mets Diagnosed in 2011, treated with  surgery, with recurrence s/p multiple radioactive ablations. Recent PET scan in September showed sacral bone metastasis, considered oligometastatic. Completed radiation therapy. Currently on Xgeva  every four weeks with local oncology. Thyroglobulin levels previously elevated at 59, indicating possible disease activity. No new lung nodules or other bony lesions noted. Nodules on PET scan are small ans stable since 10/2023. Plan to monitor thyroglobulin levels and PET scan results to determine need for systemic therapy with Lenvima if disease recurs. - Ordered blood work to check thyroglobulin levels and thyroid  function tests - Scheduled PET scan for the week of December 15th - Continue Xgeva  every four weeks - Will consider Lenvima if thyroglobulin levels if concern for disease progression/recurrence - Recommend Tg + TgAb + TSH: q3 months. - Maintain TSH suppression to <0.1 given high-risk    Dry mouth (xerostomia) Reports dry mouth, possibly related to inadequate hydration. No evidence that coconut oil use caused salivary gland issues. - Recommended biotin sprays, lozenges, or Xelomils to stimulate saliva production - Encouraged increased water intake  Constipation Reports chronic constipation. No current medication for constipation. - Recommended daily use of Miralax mixed in water - Advised increased fiber intake and hydration  Chronic pain due to bone metastasis Experiencing pain in the sacral area, rated 3/10. Pain has improved since radiation therapy. Currently managed with Tylenol . - Continue Tylenol  as needed for pain management  Advance Care Planning  ACP/GOC   The above diagnoses, treatment plan, and options were discussed with the patient/family.  They have agreed to the above plan.  They were allowed to ask questions and these were answered to their satisfaction.  There were no barriers to understanding.  A high level of complex medical decision making was utilized for the  medical management of this high risk patient.  The evaluation and management of this patient encompassed >= 80 minutes with over 50% of the time involving counseling the patient/family (on disease symptoms, treatment/side effects and/or prognosis) and coordination of care (with nursing staff, ancillary staff, nurse navigators, co-managing providers, social media planner, financial counselors, outside records review, and/or pharmacy).    This visit represents ongoing and longitudinal specialty care I am providing to the patient. Will add CPT code G2211.   Duayne Magnus, MD [facility]  Medical Oncology   Future Appointments     Date/Time Provider Department Center Visit Type   09/29/2024 10:00 AM (Arrive by 9:45 AM) Magnus Duayne, MD [facility] NEW PATIENT   10/09/2024 9:30 AM Babara Evener, MD [facility] Radiation [facility]cology [facility] TELEPHONE VISIT [facility])

## 2024-10-09 NOTE — Progress Notes (Addendum)
 RADIATION ONCOLOGY FOLLOWUP NOTE.  DISEASE TREATED: left sacral metastasis, metastatic thyroid  cancer  PRIOR TREATMENT L Sacrum SBRT 30Gy  INTERVAL SINCE TREATMENT: 0 years 4 months. Completed 09/15/24.  INTERVAL HISTORY: Tammy Nichols is seen as part of ongoing management.    Today, she is feeling well. She continued to have pain flare 3-4 days post-treatment but this resolved. She had 1 episode of significant fatigue recently. No further nausea. At this time, she feels back to baseline. She met with DR. Karivedu and is anticipating a PET in 1 week with plan to start lenvatinib if e/o metastatic dz.  PHYSICAL EXAMINATION: Wt Readings from Last 3 Encounters:  09/29/24 82.7 kg (182 lb 5.1 oz)  09/11/24 82.3 kg (181 lb 7 oz)  08/07/24 82.8 kg (182 lb 8.7 oz)   There were no vitals taken for this visit. There is no height or weight on file to calculate BMI. Physical exam is not performed    ASSESSMENT: The patient is recovered from her SBRT. She is scheduled for upcoming PET in 1 week. I explained that at this time point early after SBRT, the uptake could be increased in the bony sacral lesion s/p XRT as c/w post-treatment change, or if there has been an early response, it's also possible for the uptake to be decreased. Ongoing response in the lesion over time will be more reflective of local control s/p SBRT.  PLAN: FU 4 months Continue FUV with Dr. Erwin   She understands that she should contact us  with any concerns or questions in the meantime.   Tammy CAP, MD  Future Appointments  Date Time Provider Department Center  10/16/2024  1:00 PM CC PET INJ IN21 [facility]  10/16/2024  2:30 PM CC PET SR 1 [facility]  Nov 03, 2024 10:40 AM Erwin Cashing, MD [facility]        This telephone encounter was conducted with the patient's (or proxy's) verbal consent via secure, interactive audio telecommunications while in  clinic/office/hospital.  The patient (or proxy) was instructed to have this encounter in a suitably private space and to only have persons present to whom they give permission to participate. In addition, the patient identity was confirmed by use of name plus an additional identifier.  I spent 20 minutes with patient and/or family on this phone visit today.  Nov 03, 2020 E&M) This visit was coded based on time. I spent a total of 20 minutes in both face-to-face and non-face-to-face activities for this visit on the date of this encounter.

## 2024-10-24 ENCOUNTER — Telehealth: Payer: Self-pay | Admitting: Oncology

## 2024-10-24 NOTE — Telephone Encounter (Signed)
 I spoke with patient and she stated she will be having Xgeva  injections at [facility]. Will call back if anything changes.

## 2024-10-28 ENCOUNTER — Telehealth: Payer: Self-pay

## 2024-10-28 NOTE — Telephone Encounter (Signed)
 Followed up with Tammy Nichols and Xgeva  Injection appointments. She stated she is not coming back here and is instead attending appointments with [facility] for treatment. Dr. Autumn made aware.
